# Patient Record
Sex: Male | Born: 2000 | Race: Black or African American | Hispanic: No | Marital: Single | Smoking: Former smoker
Health system: Southern US, Community
[De-identification: ages and names within clinical notes are randomized; demographics above are authoritative.]

## PROBLEM LIST (undated history)

## (undated) DIAGNOSIS — T7840XA Allergy, unspecified, initial encounter: Secondary | ICD-10-CM

## (undated) DIAGNOSIS — K219 Gastro-esophageal reflux disease without esophagitis: Secondary | ICD-10-CM

## (undated) DIAGNOSIS — F419 Anxiety disorder, unspecified: Secondary | ICD-10-CM

## (undated) HISTORY — DX: Allergy, unspecified, initial encounter: T78.40XA

## (undated) HISTORY — PX: NO PAST SURGERIES: SHX2092

## (undated) HISTORY — DX: Anxiety disorder, unspecified: F41.9

## (undated) HISTORY — PX: WISDOM TOOTH EXTRACTION: SHX21

---
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
## 2019-07-23 ENCOUNTER — Other Ambulatory Visit: Payer: Self-pay

## 2019-07-23 DIAGNOSIS — Z20822 Contact with and (suspected) exposure to covid-19: Secondary | ICD-10-CM

## 2019-07-24 ENCOUNTER — Telehealth: Payer: BC Managed Care – PPO | Admitting: Physician Assistant

## 2019-07-24 ENCOUNTER — Encounter (INDEPENDENT_AMBULATORY_CARE_PROVIDER_SITE_OTHER): Payer: Self-pay

## 2019-07-24 DIAGNOSIS — Z20822 Contact with and (suspected) exposure to covid-19: Secondary | ICD-10-CM

## 2019-07-24 NOTE — Progress Notes (Signed)
E-Visit for Corona Virus Screening   Your current symptoms could be consistent with the coronavirus.  Many health care providers can now test patients at their office but not all are.  [facility] has multiple testing sites. For information on our COVID testing locations and hours go to [email or web address]  Please quarantine yourself while awaiting your test results.  We are enrolling you in our MyChart Home Montioring for COVID19 . Daily you will receive a questionnaire within the MyChart website. Our COVID 19 response team willl be monitoriing your responses daily.    COVID-19 is a respiratory illness with symptoms that are similar to the flu. Symptoms are typically mild to moderate, but there have been cases of severe illness and death due to the virus. The following symptoms may appear 2-14 days after exposure: . Fever . Cough . Shortness of breath or difficulty breathing . Chills . Repeated shaking with chills . Muscle pain . Headache . Sore throat . New loss of taste or smell . Fatigue . Congestion or runny nose . Nausea or vomiting . Diarrhea  It is vitally important that if you feel that you have an infection such as this virus or any other virus that you stay home and away from places where you may spread it to others.  You should self-quarantine for 14 days if you have symptoms that could potentially be coronavirus or have been in close contact a with a person diagnosed with COVID-19 within the last 2 weeks. You should avoid contact with people age 65 and older.   You should wear a mask or cloth face covering over your nose and mouth if you must be around other people or animals, including pets (even at home). Try to stay at least 6 feet away from other people. This will protect the people around you.  You may also take acetaminophen (Tylenol) as needed for fever.   Reduce your risk of any infection by using the same precautions used for avoiding the  common cold or flu:  . Wash your hands often with soap and warm water for at least 20 seconds.  If soap and water are not readily available, use an alcohol-based hand sanitizer with at least 60% alcohol.  . If coughing or sneezing, cover your mouth and nose by coughing or sneezing into the elbow areas of your shirt or coat, into a tissue or into your sleeve (not your hands). . Avoid shaking hands with others and consider head nods or verbal greetings only. . Avoid touching your eyes, nose, or mouth with unwashed hands.  . Avoid close contact with people who are sick. . Avoid places or events with large numbers of people in one location, like concerts or sporting events. . Carefully consider travel plans you have or are making. . If you are planning any travel outside or inside the [place], visit the CDC's Travelers' Health webpage for the latest health notices. . If you have some symptoms but not all symptoms, continue to monitor at home and seek medical attention if your symptoms worsen. . If you are having a medical emergency, call 911.  HOME CARE . Only take medications as instructed by your medical team. . Drink plenty of fluids and get plenty of rest. . A steam or ultrasonic humidifier can help if you have congestion.   GET HELP RIGHT AWAY IF YOU HAVE EMERGENCY WARNING SIGNS** FOR COVID-19. If you or someone is showing any of these signs seek emergency medical care immediately. Call   911 or proceed to your closest emergency facility if: . You develop worsening high fever. . Trouble breathing . Bluish lips or face . Persistent pain or pressure in the chest . New confusion . Inability to wake or stay awake . You cough up blood. . Your symptoms become more severe  **This list is not all possible symptoms. Contact your medical provider for any symptoms that are sever or concerning to you.   MAKE SURE YOU   Understand these instructions.  Will watch your condition.  Will get help right  away if you are not doing well or get worse.  Your e-visit answers were reviewed by a board certified advanced clinical practitioner to complete your personal care plan.  Depending on the condition, your plan could have included both over the counter or prescription medications.  If there is a problem please reply once you have received a response from your provider.  Your safety is important to us.  If you have drug allergies check your prescription carefully.    You can use MyChart to ask questions about today's visit, request a non-urgent call back, or ask for a work or school excuse for 24 hours related to this e-Visit. If it has been greater than 24 hours you will need to follow up with your provider, or enter a new e-Visit to address those concerns. You will get an e-mail in the next two days asking about your experience.  I hope that your e-visit has been valuable and will speed your recovery. Thank you for using e-visits.    

## 2019-07-24 NOTE — Progress Notes (Signed)
I have spent 5 minutes in review of e-visit questionnaire, review and updating patient chart, medical decision making and response to patient.   Renne Platts Cody Lindzey Zent, PA-C    

## 2019-07-25 ENCOUNTER — Encounter (INDEPENDENT_AMBULATORY_CARE_PROVIDER_SITE_OTHER): Payer: Self-pay

## 2019-07-25 LAB — NOVEL CORONAVIRUS, NAA: SARS-CoV-2, NAA: NOT DETECTED

## 2019-07-27 ENCOUNTER — Encounter (INDEPENDENT_AMBULATORY_CARE_PROVIDER_SITE_OTHER): Payer: Self-pay

## 2020-06-07 ENCOUNTER — Encounter: Payer: Self-pay | Admitting: Emergency Medicine

## 2020-06-07 ENCOUNTER — Other Ambulatory Visit: Payer: Self-pay

## 2020-06-07 ENCOUNTER — Ambulatory Visit
Admission: EM | Admit: 2020-06-07 | Discharge: 2020-06-07 | Disposition: A | Discharge status: Home / Self Care | Payer: BC Managed Care – PPO | Attending: Emergency Medicine | Admitting: Emergency Medicine

## 2020-06-07 DIAGNOSIS — R1310 Dysphagia, unspecified: Secondary | ICD-10-CM

## 2020-06-07 HISTORY — DX: Gastro-esophageal reflux disease without esophagitis: K21.9

## 2020-06-07 MED ORDER — FAMOTIDINE 20 MG PO TABS: 20.0000 mg | ORAL_TABLET | Freq: Two times a day (BID) | ORAL | 0 refills | Status: DC

## 2020-06-07 MED ORDER — LIDOCAINE VISCOUS HCL 2 % MT SOLN: 15.0000 mL | Freq: Once | OROMUCOSAL | Status: DC

## 2020-06-07 MED ORDER — ALUM & MAG HYDROXIDE-SIMETH 200-200-20 MG/5ML PO SUSP
30.0000 mL | Freq: Once | ORAL | Status: AC
  Administered 2020-06-07: 30 mL via ORAL

## 2020-06-07 MED ORDER — LIDOCAINE VISCOUS HCL 2 % MT SOLN
15.0000 mL | Freq: Once | OROMUCOSAL | Status: AC
  Administered 2020-06-07: 15 mL via ORAL

## 2020-06-07 MED ORDER — ALUM & MAG HYDROXIDE-SIMETH 200-200-20 MG/5ML PO SUSP: 15.0000 mL | Freq: Once | ORAL | Status: DC

## 2020-06-07 NOTE — ED Provider Notes (Signed)
HPI  SUBJECTIVE:  Scott Silva is a 19 y.o. male who presents with 3 days of upper midline sharp chest pain and a sensation of food/liquids getting  "stuck in my mid chest".  He reports equal pain with swallowing liquids and solids.  He denies trauma, recent bone ingestion, foreign body ingestion, partially chewed food.  No regurgitation, drooling.  No problems with his saliva getting "stuck".  No difficulty breathing, fevers, or GERD symptoms.  He states that he has belching, but no more than usual.  No sore throat, neck stiffness, cough, shortness of breath.  He tried salt water gargles, ginger ale.  Symptoms are worse with ginger ale, eating and drinking.  He has had symptoms like this before which resolved with warm salt water.  He has a past medical history of GERD for which he takes omeprazole.  States he is compliant with this.  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  He denies recent ingestion of either 1 of these foods.  No history of esophageal spasm, diabetes, hypertension, cancer.  PMD: [facility].    Past Medical History:  Diagnosis Date  . GERD (gastroesophageal reflux disease)     Past Surgical History:  Procedure Laterality Date  . NO PAST SURGERIES      Family History  Problem Relation Age of Onset  . Migraines Mother   . Healthy Father     Social History   Tobacco Use  . Smoking status: Never Smoker  . Smokeless tobacco: Never Used  Vaping Use  . Vaping Use: Never used  Substance Use Topics  . Alcohol use: No  . Drug use: No    No current facility-administered medications for this encounter.  Current Outpatient Medications:  .  omeprazole (PRILOSEC) 20 MG capsule, Take 20 mg by mouth daily., Disp: , Rfl:  .  famotidine (PEPCID) 20 MG tablet, Take 1 tablet (20 mg total) by mouth 2 (two) times daily., Disp: 60 tablet, Rfl: 0  No Known Allergies   ROS  As noted in HPI.   Physical Exam  BP 113/70 (BP Location: Left Arm)   Pulse  77   Temp 98.3 F (36.8 C) (Oral)   Resp 18   Ht 5\' 7"  (1.702 m)   Wt 102.1 kg   SpO2 99%   BMI 35.24 kg/m   Constitutional: Well developed, well nourished, no acute distress Eyes:  EOMI, conjunctiva normal bilaterally HENT: Normocephalic, atraumatic,mucus membranes moist.  Airway widely patent.  Positive cobblestoning.  Normal oropharynx.  Normal posterior oropharynx.  No drooling, trismus, stridor Neck: No cervical lymphadenopathy, neck stiffness Respiratory: Normal inspiratory effort, lungs clear bilaterally Cardiovascular: Normal rate no murmurs rubs or gallops GI: nondistended skin: No rash, skin intact Musculoskeletal: no deformities Neurologic: Alert & oriented x 3, no focal neuro deficits Psychiatric: Speech and behavior appropriate   ED Course   Medications  alum & mag hydroxide-simeth (MAALOX/MYLANTA) 200-200-20 MG/5ML suspension 30 mL (30 mLs Oral Given 06/07/20 1136)    And  lidocaine (XYLOCAINE) 2 % viscous mouth solution 15 mL (15 mLs Oral Given 06/07/20 1136)    Orders Placed This Encounter  Procedures  . Ambulatory referral to Gastroenterology    Referral Priority:   Urgent    Referral Type:   Consultation    Referral Reason:   Specialty Services Required    Number of Visits Requested:   1    No results found for this or any previous visit (from the past 24 hour(s)).  No results found.  ED Clinical Impression  1. Odynophagia      ED Assessment/Plan  In the differential is esophageal spasm, stricture, globus sensation, eosinophilic esophagitis, achalasia.  Doubt mass compressing the esophagus.  There is no drooling, stridor, evidence of upper airway obstruction.  Appears well-hydrated, his vitals are normal.  He denies foreign body or bone ingestion.  Doubt esophageal rupture or perforation in the absence of forceful vomiting.  Patient has no history of HIV, no HIV risk factors and declines testing for HIV today.  Will give him a GI cocktail, start him  on some Pepcid and place an urgent referral to GI.  Discussed  MDM, treatment plan, and plan for follow-up with patient. Discussed sn/sx that should prompt return to the ED. patient agrees with plan.   Meds ordered this encounter  Medications  . DISCONTD: alum & mag hydroxide-simeth (MAALOX/MYLANTA) 200-200-20 MG/5ML suspension 15 mL  . DISCONTD: lidocaine (XYLOCAINE) 2 % viscous mouth solution 15 mL  . AND Linked Order Group   . alum & mag hydroxide-simeth (MAALOX/MYLANTA) 200-200-20 MG/5ML suspension 30 mL   . lidocaine (XYLOCAINE) 2 % viscous mouth solution 15 mL  . famotidine (PEPCID) 20 MG tablet    Sig: Take 1 tablet (20 mg total) by mouth 2 (two) times daily.    Dispense:  60 tablet    Refill:  0    *This clinic note was created using Scientist, clinical (histocompatibility and immunogenetics). Therefore, there may be occasional mistakes despite careful proofreading.   ?   Domenick Gong, MD 06/07/20 1439

## 2020-06-07 NOTE — Discharge Instructions (Signed)
Small bites of food.  Make sure you chew thoroughly.  Start the Pepcid.  Continue Protonix.  Somebody from [facility] should be contacting you to arrange a follow-up appointment, however, try and get in touch with them to arrange something soon as possible.

## 2020-06-07 NOTE — ED Triage Notes (Signed)
Patient in today c/o difficulty swallowing and pain when he swallows x 2 days. Patient states it feels like something is stuck in his throat/esophagus.

## 2020-06-27 ENCOUNTER — Other Ambulatory Visit: Payer: Self-pay

## 2020-06-28 ENCOUNTER — Other Ambulatory Visit: Payer: Self-pay

## 2020-06-28 ENCOUNTER — Ambulatory Visit: Payer: BC Managed Care – PPO | Admitting: Gastroenterology

## 2020-06-28 ENCOUNTER — Encounter: Payer: Self-pay | Admitting: Gastroenterology

## 2020-06-28 VITALS — BP 137/79 | HR 66 | Temp 98.3°F | Ht 67.0 in | Wt 212.2 lb

## 2020-06-28 DIAGNOSIS — R1319 Other dysphagia: Secondary | ICD-10-CM

## 2020-06-28 DIAGNOSIS — R1310 Dysphagia, unspecified: Secondary | ICD-10-CM | POA: Diagnosis not present

## 2020-06-28 NOTE — Progress Notes (Signed)
Arlyss Repress, MD [address]  [address]  [place], [place] [address]  Main: [phone]  Fax: [phone]    Gastroenterology Consultation  Referring Provider:     Domenick Gong, MD Primary Care Physician:  Pcp, No Primary Gastroenterologist:  Dr. Arlyss Repress Reason for Consultation:     Difficulty swallowing        HPI:   Scott Silva is a 19 y.o. male referred by Dr. Oneita Hurt, No  for consultation & management of intermittent episodes of difficulty swallowing.  He reports history of sensation of feeling lump in his throat that occurred about 2 weeks ago.  He ate taco from [employer], woke up with sensation of food stuck in his throat.  He has history of allergy to peanuts.  Patient was taking omeprazole in the past.  Currently he is taking Pepcid 20 mg once a day.  He also has seasonal allergies.  He denies any other symptoms He does not smoke or drink alcohol  NSAIDs: None  Antiplts/Anticoagulants/Anti thrombotics: None  GI Procedures: None  Past Medical History:  Diagnosis Date  . GERD (gastroesophageal reflux disease)     Past Surgical History:  Procedure Laterality Date  . NO PAST SURGERIES      Current Outpatient Medications:  .  famotidine (PEPCID) 20 MG tablet, Take 1 tablet (20 mg total) by mouth 2 (two) times daily., Disp: 60 tablet, Rfl: 0 .  fluticasone (FLONASE) 50 MCG/ACT nasal spray, 1 spray by Each Nare route daily., Disp: , Rfl:    Family History  Problem Relation Age of Onset  . Migraines Mother   . Healthy Father      Social History   Tobacco Use  . Smoking status: Never Smoker  . Smokeless tobacco: Never Used  Vaping Use  . Vaping Use: Never used  Substance Use Topics  . Alcohol use: No  . Drug use: No    Allergies as of 06/28/2020  . (No Known Allergies)    Review of Systems:    All systems reviewed and negative except where noted in HPI.   Physical Exam:  BP 137/79 (BP Location: Left Arm, Patient Position:  Sitting, Cuff Size: Normal)   Pulse 66   Temp 98.3 F (36.8 C) (Oral)   Ht 5\' 7"  (1.702 m)   Wt 212 lb 4 oz (96.3 kg)   BMI 33.24 kg/m  No LMP for male patient.  General:   Alert,  Well-developed, well-nourished, pleasant and cooperative in NAD Head:  Normocephalic and atraumatic. Eyes:  Sclera clear, no icterus.   Conjunctiva pink. Ears:  Normal auditory acuity. Nose:  No deformity, discharge, or lesions. Mouth:  No deformity or lesions,oropharynx pink & moist. Neck:  Supple; no masses or thyromegaly. Lungs:  Respirations even and unlabored.  Clear throughout to auscultation.   No wheezes, crackles, or rhonchi. No acute distress. Heart:  Regular rate and rhythm; no murmurs, clicks, rubs, or gallops. Abdomen:  Normal bowel sounds. Soft, non-tender and non-distended without masses, hepatosplenomegaly or hernias noted.  No guarding or rebound tenderness.   Rectal: Not performed Msk:  Symmetrical without gross deformities. Good, equal movement & strength bilaterally. Pulses:  Normal pulses noted. Extremities:  No clubbing or edema.  No cyanosis. Neurologic:  Alert and oriented x3;  grossly normal neurologically. Skin:  Intact without significant lesions or rashes. No jaundice. Psych:  Alert and cooperative. Normal mood and affect.  Imaging Studies: None  Assessment and Plan:   Scott Silva  is a 19 y.o. African-American male with sensation of lump in the throat  Recommend EGD with proximal and distal esophageal biopsies Continue Pepcid as needed for now Advised to avoid hard meats/foods   Follow up after the endoscopy   Arlyss Repress, MD

## 2020-07-03 ENCOUNTER — Encounter: Payer: Self-pay | Admitting: Gastroenterology

## 2020-07-03 ENCOUNTER — Other Ambulatory Visit: Payer: Self-pay

## 2020-07-04 ENCOUNTER — Other Ambulatory Visit
Admission: RE | Admit: 2020-07-04 | Discharge: 2020-07-04 | Disposition: A | Discharge status: Home / Self Care | Payer: BC Managed Care – PPO | Source: Ambulatory Visit | Attending: Gastroenterology | Admitting: Gastroenterology

## 2020-07-04 DIAGNOSIS — Z20822 Contact with and (suspected) exposure to covid-19: Secondary | ICD-10-CM | POA: Diagnosis not present

## 2020-07-04 DIAGNOSIS — Z01812 Encounter for preprocedural laboratory examination: Secondary | ICD-10-CM | POA: Insufficient documentation

## 2020-07-05 LAB — SARS CORONAVIRUS 2 (TAT 6-24 HRS): SARS Coronavirus 2: NEGATIVE

## 2020-07-05 NOTE — Discharge Instructions (Signed)
General Anesthesia, Adult, Care After This sheet gives you information about how to care for yourself after your procedure. Your health care provider may also give you more specific instructions. If you have problems or questions, contact your health care provider. What can I expect after the procedure? After the procedure, the following side effects are common:  Pain or discomfort at the IV site.  Nausea.  Vomiting.  Sore throat.  Trouble concentrating.  Feeling cold or chills.  Weak or tired.  Sleepiness and fatigue.  Soreness and body aches. These side effects can affect parts of the body that were not involved in surgery. Follow these instructions at home:  For at least 24 hours after the procedure:  Have a responsible adult stay with you. It is important to have someone help care for you until you are awake and alert.  Rest as needed.  Do not: ? Participate in activities in which you could fall or become injured. ? Drive. ? Use heavy machinery. ? Drink alcohol. ? Take sleeping pills or medicines that cause drowsiness. ? Make important decisions or sign legal documents. ? Take care of children on your own. Eating and drinking  Follow any instructions from your health care provider about eating or drinking restrictions.  When you feel hungry, start by eating small amounts of foods that are soft and easy to digest (bland), such as toast. Gradually return to your regular diet.  Drink enough fluid to keep your urine pale yellow.  If you vomit, rehydrate by drinking water, juice, or clear broth. General instructions  If you have sleep apnea, surgery and certain medicines can increase your risk for breathing problems. Follow instructions from your health care provider about wearing your sleep device: ? Anytime you are sleeping, including during daytime naps. ? While taking prescription pain medicines, sleeping medicines, or medicines that make you drowsy.  Return to  your normal activities as told by your health care provider. Ask your health care provider what activities are safe for you.  Take over-the-counter and prescription medicines only as told by your health care provider.  If you smoke, do not smoke without supervision.  Keep all follow-up visits as told by your health care provider. This is important. Contact a health care provider if:  You have nausea or vomiting that does not get better with medicine.  You cannot eat or drink without vomiting.  You have pain that does not get better with medicine.  You are unable to pass urine.  You develop a skin rash.  You have a fever.  You have redness around your IV site that gets worse. Get help right away if:  You have difficulty breathing.  You have chest pain.  You have blood in your urine or stool, or you vomit blood. Summary  After the procedure, it is common to have a sore throat or nausea. It is also common to feel tired.  Have a responsible adult stay with you for the first 24 hours after general anesthesia. It is important to have someone help care for you until you are awake and alert.  When you feel hungry, start by eating small amounts of foods that are soft and easy to digest (bland), such as toast. Gradually return to your regular diet.  Drink enough fluid to keep your urine pale yellow.  Return to your normal activities as told by your health care provider. Ask your health care provider what activities are safe for you. This information is not   intended to replace advice given to you by your health care provider. Make sure you discuss any questions you have with your health care provider. Document Revised: 10/03/2017 Document Reviewed: 05/16/2017 Elsevier Patient Education  2020 Elsevier Inc[employer]  

## 2020-07-06 ENCOUNTER — Encounter
Admission: RE | Disposition: A | Discharge status: Home / Self Care | Payer: Self-pay | Source: Home / Self Care | Attending: Gastroenterology

## 2020-07-06 ENCOUNTER — Ambulatory Visit: Payer: BC Managed Care – PPO | Admitting: Anesthesiology

## 2020-07-06 ENCOUNTER — Ambulatory Visit
Admission: RE | Admit: 2020-07-06 | Discharge: 2020-07-06 | Disposition: A | Discharge status: Home / Self Care | Payer: BC Managed Care – PPO | Attending: Gastroenterology | Admitting: Gastroenterology

## 2020-07-06 ENCOUNTER — Encounter: Payer: Self-pay | Admitting: Gastroenterology

## 2020-07-06 ENCOUNTER — Other Ambulatory Visit: Payer: Self-pay

## 2020-07-06 DIAGNOSIS — K219 Gastro-esophageal reflux disease without esophagitis: Secondary | ICD-10-CM | POA: Insufficient documentation

## 2020-07-06 DIAGNOSIS — Z79899 Other long term (current) drug therapy: Secondary | ICD-10-CM | POA: Insufficient documentation

## 2020-07-06 DIAGNOSIS — R12 Heartburn: Secondary | ICD-10-CM | POA: Diagnosis present

## 2020-07-06 DIAGNOSIS — K449 Diaphragmatic hernia without obstruction or gangrene: Secondary | ICD-10-CM | POA: Insufficient documentation

## 2020-07-06 DIAGNOSIS — R1310 Dysphagia, unspecified: Secondary | ICD-10-CM

## 2020-07-06 HISTORY — PX: ESOPHAGOGASTRODUODENOSCOPY (EGD) WITH PROPOFOL: SHX5813

## 2020-07-06 SURGERY — ESOPHAGOGASTRODUODENOSCOPY (EGD) WITH PROPOFOL
Anesthesia: General | Site: Mouth

## 2020-07-06 MED ORDER — GLYCOPYRROLATE 0.2 MG/ML IJ SOLN
INTRAMUSCULAR | Status: DC | PRN
  Administered 2020-07-06: .1 mg via INTRAVENOUS

## 2020-07-06 MED ORDER — LIDOCAINE HCL (CARDIAC) PF 100 MG/5ML IV SOSY
PREFILLED_SYRINGE | INTRAVENOUS | Status: DC | PRN
  Administered 2020-07-06: 60 mg via INTRAVENOUS

## 2020-07-06 MED ORDER — PROPOFOL 10 MG/ML IV BOLUS
INTRAVENOUS | Status: DC | PRN
  Administered 2020-07-06: 30 mg via INTRAVENOUS
  Administered 2020-07-06: 70 mg via INTRAVENOUS
  Administered 2020-07-06: 50 mg via INTRAVENOUS

## 2020-07-06 MED ORDER — LACTATED RINGERS IV SOLN: INTRAVENOUS | Status: DC

## 2020-07-06 SURGICAL SUPPLY — 7 items
BLOCK BITE 60FR ADLT L/F GRN (MISCELLANEOUS) ×3 IMPLANT
FORCEPS BIOP RAD 4 LRG CAP 4 (CUTTING FORCEPS) ×3 IMPLANT
GOWN CVR UNV OPN BCK APRN NK (MISCELLANEOUS) ×2 IMPLANT
GOWN ISOL THUMB LOOP REG UNIV (MISCELLANEOUS) ×6
KIT ENDO PROCEDURE OLY (KITS) ×3 IMPLANT
MANIFOLD NEPTUNE II (INSTRUMENTS) ×3 IMPLANT
WATER STERILE IRR 250ML POUR (IV SOLUTION) ×3 IMPLANT

## 2020-07-06 NOTE — Anesthesia Postprocedure Evaluation (Signed)
Anesthesia Post Note  Patient: Database administrator  Procedure(s) Performed: ESOPHAGOGASTRODUODENOSCOPY (EGD) WITH BIOPSY (N/A Mouth)     Patient location during evaluation: PACU Anesthesia Type: General Level of consciousness: awake and alert and oriented Pain management: satisfactory to patient Vital Signs Assessment: post-procedure vital signs reviewed and stable Respiratory status: spontaneous breathing, nonlabored ventilation and respiratory function stable Cardiovascular status: blood pressure returned to baseline and stable Postop Assessment: Adequate PO intake and No signs of nausea or vomiting Anesthetic complications: no   No complications documented.  Cherly Beach

## 2020-07-06 NOTE — Anesthesia Preprocedure Evaluation (Signed)
Anesthesia Evaluation  Patient identified by MRN, date of birth, ID band Patient awake    Reviewed: Allergy & Precautions, H&P , NPO status , Patient's Chart, lab work & pertinent test results  Airway Mallampati: II  TM Distance: >3 FB Neck ROM: full    Dental no notable dental hx.    Pulmonary    Pulmonary exam normal breath sounds clear to auscultation       Cardiovascular Normal cardiovascular exam Rhythm:regular Rate:Normal     Neuro/Psych    GI/Hepatic GERD  ,  Endo/Other    Renal/GU      Musculoskeletal   Abdominal   Peds  Hematology   Anesthesia Other Findings   Reproductive/Obstetrics                             Anesthesia Physical Anesthesia Plan  ASA: II  Anesthesia Plan: General   Post-op Pain Management:    Induction: Intravenous  PONV Risk Score and Plan: 2 and Treatment may vary due to age or medical condition, Propofol infusion and TIVA  Airway Management Planned: Natural Airway  Additional Equipment:   Intra-op Plan:   Post-operative Plan:   Informed Consent: I have reviewed the patients History and Physical, chart, labs and discussed the procedure including the risks, benefits and alternatives for the proposed anesthesia with the patient or authorized representative who has indicated his/her understanding and acceptance.     Dental Advisory Given  Plan Discussed with: CRNA  Anesthesia Plan Comments:         Anesthesia Quick Evaluation  

## 2020-07-06 NOTE — H&P (Signed)
Arlyss Repress, MD [address]  [address]  [place], [place] [address]  Main: [phone]  Fax: [phone] Pager: [phone]  Primary Care Physician:  Pcp, No Primary Gastroenterologist:  Dr. Arlyss Repress  Pre-Procedure History & Physical: HPI:  Scott Silva is a 19 y.o. male is here for an endoscopy.   Past Medical History:  Diagnosis Date   GERD (gastroesophageal reflux disease)     Past Surgical History:  Procedure Laterality Date   NO PAST SURGERIES      Prior to Admission medications   Medication Sig Start Date End Date Taking? Authorizing Provider  famotidine (PEPCID) 20 MG tablet Take 1 tablet (20 mg total) by mouth 2 (two) times daily. 06/07/20  Yes Domenick Gong, MD  fluticasone (FLONASE) 50 MCG/ACT nasal spray 1 spray by Each Nare route daily. 08/20/18  Yes [provider]    Allergies as of 06/28/2020   (No Known Allergies)    Family History  Problem Relation Age of Onset   Migraines Mother    Healthy Father     Social History   Socioeconomic History   Marital status: Single    Spouse name: Not on file   Number of children: Not on file   Years of education: Not on file   Highest education level: Not on file  Occupational History   Not on file  Tobacco Use   Smoking status: Never Smoker   Smokeless tobacco: Never Used  Vaping Use   Vaping Use: Never used  Substance and Sexual Activity   Alcohol use: No   Drug use: No   Sexual activity: Not on file  Other Topics Concern   Not on file  Social History Narrative   Not on file   Social Determinants of Health   Financial Resource Strain:    Difficulty of Paying Living Expenses: Not on file  Food Insecurity:    Worried About Running Out of Food in the Last Year: Not on file   Ran Out of Food in the Last Year: Not on file  Transportation Needs:    Lack of Transportation (Medical): Not on file   Lack of Transportation (Non-Medical): Not on file   Physical Activity:    Days of Exercise per Week: Not on file   Minutes of Exercise per Session: Not on file  Stress:    Feeling of Stress : Not on file  Social Connections:    Frequency of Communication with Friends and Family: Not on file   Frequency of Social Gatherings with Friends and Family: Not on file   Attends Religious Services: Not on file   Active Member of Clubs or Organizations: Not on file   Attends Banker Meetings: Not on file   Marital Status: Not on file  Intimate Partner Violence:    Fear of Current or Ex-Partner: Not on file   Emotionally Abused: Not on file   Physically Abused: Not on file   Sexually Abused: Not on file    Review of Systems: See HPI, otherwise negative ROS  Physical Exam: BP 115/72    Pulse 63    Temp 97.7 F (36.5 C) (Temporal)    Resp 18    Ht 5\' 6"  (1.676 m)    Wt 96.2 kg    SpO2 100%    BMI 34.22 kg/m  General:   Alert,  pleasant and cooperative in NAD Head:  Normocephalic and atraumatic. Neck:  Supple; no masses or thyromegaly. Lungs:  Clear throughout to auscultation.    Heart:  Regular rate and rhythm. Abdomen:  Soft, nontender and nondistended. Normal bowel sounds, without guarding, and without rebound.   Neurologic:  Alert and  oriented x4;  grossly normal neurologically.  Impression/Plan: Scott Silva is here for an endoscopy to be performed for dysphagia  Risks, benefits, limitations, and alternatives regarding  endoscopy have been reviewed with the patient.  Questions have been answered.  All parties agreeable.   Lannette Donath, MD  07/06/2020, 8:53 AM

## 2020-07-06 NOTE — Op Note (Signed)
[facility] Patient Name: Scott Silva Procedure Date: 07/06/2020 9:27 AM MRN: [id number] Account #: [id number] Date of Birth: 07-27-2001 Admit Type: Outpatient Age: 19 Room: [facility] OR ROOM 01 Gender: Male Note Status: Finalized Procedure:             Upper GI endoscopy Indications:           Dysphagia, Heartburn Providers:             Lin Landsman MD, MD Medicines:             Monitored Anesthesia Care Complications:         No immediate complications. Estimated blood loss: None. Procedure:             Pre-Anesthesia Assessment:                        - Prior to the procedure, a History and Physical was                         performed, and patient medications and allergies were                         reviewed. The patient is competent. The risks and                         benefits of the procedure and the sedation options and                         risks were discussed with the patient. All questions                         were answered and informed consent was obtained.                         Patient identification and proposed procedure were                         verified by the physician, the nurse, the                         anesthesiologist, the anesthetist and the technician                         in the pre-procedure area in the procedure room in the                         endoscopy suite. Mental Status Examination: alert and                         oriented. Airway Examination: normal oropharyngeal                         airway and neck mobility. Respiratory Examination:                         clear to auscultation. CV Examination: normal.                         Prophylactic Antibiotics: The patient does not require  prophylactic antibiotics. Prior Anticoagulants: The                         patient has taken no previous anticoagulant or                         antiplatelet agents. ASA Grade  Assessment: II - A                         patient with mild systemic disease. After reviewing                         the risks and benefits, the patient was deemed in                         satisfactory condition to undergo the procedure. The                         anesthesia plan was to use monitored anesthesia care                         (MAC). Immediately prior to administration of                         medications, the patient was re-assessed for adequacy                         to receive sedatives. The heart rate, respiratory                         rate, oxygen saturations, blood pressure, adequacy of                         pulmonary ventilation, and response to care were                         monitored throughout the procedure. The physical                         status of the patient was re-assessed after the                         procedure.                        After obtaining informed consent, the endoscope was                         passed under direct vision. Throughout the procedure,                         the patient's blood pressure, pulse, and oxygen                         saturations were monitored continuously. The was                         introduced through the mouth, and advanced to the  second part of duodenum. The upper GI endoscopy was                         accomplished without difficulty. The patient tolerated                         the procedure well. Findings:      The esophagus, gastroesophageal junction and examined esophagus were       normal. Biopsies were taken with a cold forceps for histology.      The stomach was normal.      The examined duodenum was normal.      A small hiatal hernia was present.      Esophagogastric landmarks were identified: the gastroesophageal junction       was found at 37 cm from the incisors. Impression:            - Normal esophagus and gastroesophageal junction.                          Biopsied.                        - Normal stomach.                        - Normal examined duodenum.                        - Small hiatal hernia.                        - Esophagogastric landmarks identified. Recommendation:        - Await pathology results.                        - Discharge patient to home (with escort).                        - Resume previous diet today.                        - Continue present medications.                        - Await pathology results. Procedure Code(s):     --- Professional ---                        [phone], Esophagogastroduodenoscopy, flexible,                         transoral; with biopsy, single or multiple Diagnosis Code(s):     --- Professional ---                        K44.9, Diaphragmatic hernia without obstruction or                         gangrene                        R13.10, Dysphagia, unspecified  R12, Heartburn CPT copyright 2019 American Medical Association. All rights reserved. The codes documented in this report are preliminary and upon coder review may  be revised to meet current compliance requirements. Dr. Ulyess Mort Lin Landsman MD, MD 07/06/2020 9:44:10 AM This report has been signed electronically. Number of Addenda: 0 Note Initiated On: 07/06/2020 9:27 AM Estimated Blood Loss:  Estimated blood loss: none.      [facility]

## 2020-07-06 NOTE — Anesthesia Procedure Notes (Signed)
Procedure Name: MAC Date/Time: 07/06/2020 9:37 AM Performed by: Silvana Newness, CRNA Pre-anesthesia Checklist: Patient identified, Emergency Drugs available, Suction available, Patient being monitored and Timeout performed Patient Re-evaluated:Patient Re-evaluated prior to induction Oxygen Delivery Method: Nasal cannula Induction Type: IV induction Placement Confirmation: positive ETCO2

## 2020-07-06 NOTE — Transfer of Care (Signed)
Immediate Anesthesia Transfer of Care Note  Patient: Scott Silva  Procedure(s) Performed: ESOPHAGOGASTRODUODENOSCOPY (EGD) WITH BIOPSY (N/A Mouth)  Patient Location: PACU  Anesthesia Type: General  Level of Consciousness: awake, alert  and patient cooperative  Airway and Oxygen Therapy: Patient Spontanous Breathing and Patient connected to supplemental oxygen  Post-op Assessment: Post-op Vital signs reviewed, Patient's Cardiovascular Status Stable, Respiratory Function Stable, Patent Airway and No signs of Nausea or vomiting  Post-op Vital Signs: Reviewed and stable  Complications: No complications documented.

## 2020-07-07 ENCOUNTER — Encounter: Payer: Self-pay | Admitting: Gastroenterology

## 2020-07-10 ENCOUNTER — Telehealth: Payer: Self-pay

## 2020-07-10 LAB — SURGICAL PATHOLOGY

## 2020-07-10 NOTE — Telephone Encounter (Signed)
-----   Message from Toney Reil, MD sent at 07/10/2020  2:53 PM EDT ----- Pathology results from recent upper endoscopy suggest mild inflammation in the esophagus, therefore, recommend trial of Prilosec 40 mg once a day before breakfast for 3 months  Recommend follow-up in 3 monthsRohini Vanga

## 2020-07-10 NOTE — Telephone Encounter (Signed)
Called and left a message for call back  

## 2020-07-11 MED ORDER — OMEPRAZOLE 40 MG PO CPDR: 40.0000 mg | DELAYED_RELEASE_CAPSULE | Freq: Every day | ORAL | 3 refills | Status: DC

## 2020-07-11 NOTE — Telephone Encounter (Signed)
Called and left a message for call back. Sent mychart message and sent medication to the pharmacy  

## 2021-11-13 ENCOUNTER — Emergency Department
Admission: EM | Admit: 2021-11-13 | Discharge: 2021-11-13 | Disposition: A | Discharge status: Home / Self Care | Payer: BC Managed Care – PPO | Attending: Emergency Medicine | Admitting: Emergency Medicine

## 2021-11-13 ENCOUNTER — Emergency Department: Payer: BC Managed Care – PPO

## 2021-11-13 ENCOUNTER — Other Ambulatory Visit: Payer: Self-pay

## 2021-11-13 DIAGNOSIS — F410 Panic disorder [episodic paroxysmal anxiety] without agoraphobia: Secondary | ICD-10-CM | POA: Insufficient documentation

## 2021-11-13 DIAGNOSIS — R531 Weakness: Secondary | ICD-10-CM | POA: Diagnosis not present

## 2021-11-13 DIAGNOSIS — R791 Abnormal coagulation profile: Secondary | ICD-10-CM | POA: Insufficient documentation

## 2021-11-13 DIAGNOSIS — R202 Paresthesia of skin: Secondary | ICD-10-CM | POA: Insufficient documentation

## 2021-11-13 DIAGNOSIS — R200 Anesthesia of skin: Secondary | ICD-10-CM

## 2021-11-13 LAB — COMPREHENSIVE METABOLIC PANEL
ALT: 20 U/L (ref 0–44)
AST: 19 U/L (ref 15–41)
Albumin: 4.7 g/dL (ref 3.5–5.0)
Alkaline Phosphatase: 77 U/L (ref 38–126)
Anion gap: 8 (ref 5–15)
BUN: 13 mg/dL (ref 6–20)
CO2: 23 mmol/L (ref 22–32)
Calcium: 9.6 mg/dL (ref 8.9–10.3)
Chloride: 105 mmol/L (ref 98–111)
Creatinine, Ser: 0.98 mg/dL (ref 0.61–1.24)
GFR, Estimated: >60 mL/min (ref >60)
Glucose, Bld: 102 mg/dL — ABNORMAL HIGH (ref 70–99)
Potassium: 3.6 mmol/L (ref 3.5–5.1)
Sodium: 136 mmol/L (ref 135–145)
Total Bilirubin: 0.4 mg/dL (ref 0.3–1.2)
Total Protein: 8 g/dL (ref 6.5–8.1)

## 2021-11-13 LAB — CBC
HCT: 43.2 % (ref 39.0–52.0)
Hemoglobin: 14 g/dL (ref 13.0–17.0)
MCH: 26.9 pg (ref 26.0–34.0)
MCHC: 32.4 g/dL (ref 30.0–36.0)
MCV: 83.1 fL (ref 80.0–100.0)
Platelets: 299 10*3/uL (ref 150–400)
RBC: 5.2 MIL/uL (ref 4.22–5.81)
RDW: 13.6 % (ref 11.5–15.5)
WBC: 7.4 10*3/uL (ref 4.0–10.5)
nRBC: 0 % (ref 0.0–0.2)

## 2021-11-13 LAB — DIFFERENTIAL
Abs Immature Granulocytes: 0.02 10*3/uL (ref 0.00–0.07)
Basophils Absolute: 0.1 10*3/uL (ref 0.0–0.1)
Basophils Relative: 1 %
Eosinophils Absolute: 0.2 10*3/uL (ref 0.0–0.5)
Eosinophils Relative: 2 %
Immature Granulocytes: 0 %
Lymphocytes Relative: 27 %
Lymphs Abs: 2 10*3/uL (ref 0.7–4.0)
Monocytes Absolute: 0.6 10*3/uL (ref 0.1–1.0)
Monocytes Relative: 8 %
Neutro Abs: 4.6 10*3/uL (ref 1.7–7.7)
Neutrophils Relative %: 62 %

## 2021-11-13 LAB — CBG MONITORING, ED
Glucose-Capillary: 62 mg/dL — ABNORMAL LOW (ref 70–99)
Glucose-Capillary: 115 mg/dL — ABNORMAL HIGH (ref 70–99)
Glucose-Capillary: 69 mg/dL — ABNORMAL LOW (ref 70–99)

## 2021-11-13 LAB — PROTIME-INR
INR: 1 (ref 0.8–1.2)
Prothrombin Time: 13.1 seconds (ref 11.4–15.2)

## 2021-11-13 LAB — APTT: aPTT: 28 seconds (ref 24–36)

## 2021-11-13 MED ORDER — SODIUM CHLORIDE 0.9% FLUSH
3.0000 mL | Freq: Once | INTRAVENOUS | Status: AC
  Administered 2021-11-13: 3 mL via INTRAVENOUS

## 2021-11-13 MED ORDER — LORAZEPAM 1 MG PO TABS
1.0000 mg | ORAL_TABLET | Freq: Once | ORAL | Status: AC
  Administered 2021-11-13: 1 mg via ORAL
  Filled 2021-11-13: qty 1

## 2021-11-13 NOTE — ED Notes (Signed)
Patient provided with orange juice  

## 2021-11-13 NOTE — ED Triage Notes (Addendum)
Pt comes into the ED via EMS from home with c/o left arm numbness and left facial numbness, states sx started at 11am today when he woke up, pt c/o pressure in his head. No facial droop, no drift noted on arrival  146/82 100%RA [id number]

## 2021-11-13 NOTE — ED Triage Notes (Signed)
Pt reports went to bed at 2130 last night, woke up at 1100 and noticed "pressure" in roof of mouth and numbness in left hand. Clear speech. Weaker grip in left hand. No drift. No facial droop. Alert and oriented.

## 2021-11-13 NOTE — ED Provider Notes (Signed)
[facility] Provider Note    Event Date/Time   First MD Initiated Contact with Patient 11/13/21 1336     (approximate)   History   Numbness   HPI  Scott Silva is a 21 y.o. male with past medical history of anxiety and GERD who presents with numbness.  Patient took a nap and around 11 AM woke up felt that his left hand and arm were numb.  Also endorses numb feeling in the bilateral temporal region.  He has associated weakness with left grip.  Denies visual change difficulty speaking or any symptoms in his legs.  Patient has experienced associated anxiety.  Patient tells me has a history of anxiety and has had numbness in the past with prior episodes of anxiety.  Not currently take anything for it.  Denies any drug or alcohol use.  Review of prior records patient was seen in the ED on 1/9 for facial numbness and bilateral hand tingling and was discharged on p.o. Ativan.  Was noted at that time that patient had had a recent traumatic event, 72-year-old cousin had died suddenly in patient had been experiencing multiple somatic symptoms since that time.    Past Medical History:  Diagnosis Date   GERD (gastroesophageal reflux disease)     Patient Active Problem List   Diagnosis Date Noted   Dysphagia    Overweight in childhood with body mass index (BMI) of 85th to 94.9th percentile 08/20/2017   Allergic rhinitis 12/11/2016   Acne 04/13/2013     Physical Exam  Triage Vital Signs: ED Triage Vitals [11/13/21 1214]  Enc Vitals Group     BP 125/63     Pulse Rate (!) 58     Resp 20     Temp 98.3 F (36.8 C)     Temp Source Oral     SpO2 100 %     Weight 189 lb (85.7 kg)     Height 5\' 6"  (1.676 m)     Head Circumference      Peak Flow      Pain Score 0     Pain Loc      Pain Edu?      Excl. in GC?     Most recent vital signs: Vitals:   11/13/21 1214 11/13/21 1341  BP: 125/63 125/68  Pulse: (!) 58 62  Resp: 20 18  Temp: 98.3 F (36.8 C)    SpO2: 100% 100%     General: Awake, patient is tearful, diffuse body shaking CV:  Good peripheral perfusion.  Resp:  Normal effort.  Abd:  No distention.  Neuro:             Awake, Alert, Oriented x 3  Other:  Patient appears very anxious, speech is soft Aox3, nml speech  PERRL, EOMI, face symmetric, nml tongue movement  Subjective decrease sensation over the bilateral temples but not involving the forehead or mandibular region 5/5 strength in the BL upper and lower extremities with elbow flexion, extension and there is no pronator drift Decreased strength with left grip compared to right Subjective decrease sensation in the right hand up to the wrist, normal sensation in the bilateral upper arms Finger-nose-finger intact BL    ED Results / Procedures / Treatments  Labs (all labs ordered are listed, but only abnormal results are displayed) Labs Reviewed  COMPREHENSIVE METABOLIC PANEL - Abnormal; Notable for the following components:      Result Value   Glucose, Bld 102 (*)  All other components within normal limits  CBG MONITORING, ED - Abnormal; Notable for the following components:   Glucose-Capillary 62 (*)    All other components within normal limits  CBG MONITORING, ED - Abnormal; Notable for the following components:   Glucose-Capillary 69 (*)    All other components within normal limits  CBG MONITORING, ED - Abnormal; Notable for the following components:   Glucose-Capillary 115 (*)    All other components within normal limits  PROTIME-INR  APTT  CBC  DIFFERENTIAL     EKG  [place] bradycardia, normal axis normal intervals no acute ischemic changes   RADIOLOGY I reviewed the CT scan of the brain which does not show any acute intracranial process; agree with radiology report     PROCEDURES:  Critical Care performed: No     MEDICATIONS ORDERED IN ED: Medications  sodium chloride flush (NS) 0.9 % injection 3 mL (3 mLs Intravenous Given 11/13/21 1341)   LORazepam (ATIVAN) tablet 1 mg (1 mg Oral Given 11/13/21 1423)     IMPRESSION / MDM / ASSESSMENT AND PLAN / ED COURSE  I reviewed the triage vital signs and the nursing notes.                              Differential diagnosis includes, but is not limited to, anxiety attack, functional disorder, CVA, demyelinating disease  Patient is a 21 year old male presents with acute onset of numbness after waking from a nap today.  This is associated with significant anxiety.  On exam patient appears somewhat distressed he is tearful and has shaking and appears very anxious.  His neurologic exam overall is reassuring and that his weakness is really only with grip but he has full strength with elbow flexion and extension and there is no pronator drift.  Sensory deficit is also restricted to the hand and just above the wrist, and in the bilateral temples but not involving the forehead or mandible.  The deficits really do not correspond to a neurologic territory.  I reviewed recent ED visit at outside hospital where patient presented similarly and it was felt that his symptoms were due to anxiety and that time it was noted that he had had a death of a young cousin several days before.  I have very low suspicion for acute CVA at this time.  Demyelinating disorder is always possible but also in the setting of the anxiety and prior symptoms feel that is more likely to be functional versus related to anxiety.  I reviewed his blood work which is notable for a sugar of 62-says he had not eaten much today 4.  Rest of his labs including a CBC and CMP are reassuring.  CT head was obtained which shows no acute pathology.  We will give him a 1mg  of Ativan for anxiety and reassess.  After Ativan patient looks significantly more comfortable and is calm.  On repeat assessment he has full grip strength now in the left hand and says that the numbness is improving.  On further discussion patient witnessed his 23-year-old cousin has  a cardiac arrest from bleeding tonsils and has been struggling with this since.  I encouraged him to speak with a therapist or psychiatrist will refer to [facility].  Patient's grandmother who is at bedside understands and is in agreement with the plan.  He is stable for discharge.  Clinical Course as of 11/13/21 1502  Tue Nov 13, 2021  1359 Creatinine: 0.98 [KM]    Clinical Course User Index [KM] Georga Hacking, MD     FINAL CLINICAL IMPRESSION(S) / ED DIAGNOSES   Final diagnoses:  Numbness  Panic attack     Rx / DC Orders   ED Discharge Orders     None        Note:  This document was prepared using Dragon voice recognition software and may include unintentional dictation errors.   Georga Hacking, MD 11/13/21 [phone]

## 2021-12-18 ENCOUNTER — Ambulatory Visit: Payer: BC Managed Care – PPO | Admitting: Family Medicine

## 2021-12-18 ENCOUNTER — Other Ambulatory Visit: Payer: Self-pay

## 2021-12-18 ENCOUNTER — Encounter: Payer: Self-pay | Admitting: Family Medicine

## 2021-12-18 VITALS — BP 114/72 | HR 63 | Ht 66.0 in | Wt 189.0 lb

## 2021-12-18 DIAGNOSIS — R079 Chest pain, unspecified: Secondary | ICD-10-CM | POA: Diagnosis not present

## 2021-12-18 DIAGNOSIS — F418 Other specified anxiety disorders: Secondary | ICD-10-CM | POA: Diagnosis not present

## 2021-12-18 DIAGNOSIS — K219 Gastro-esophageal reflux disease without esophagitis: Secondary | ICD-10-CM | POA: Diagnosis not present

## 2021-12-18 DIAGNOSIS — R0789 Other chest pain: Secondary | ICD-10-CM | POA: Insufficient documentation

## 2021-12-18 MED ORDER — HYDROXYZINE PAMOATE 25 MG PO CAPS: 25.0000 mg | ORAL_CAPSULE | Freq: Three times a day (TID) | ORAL | 0 refills | Status: DC | PRN

## 2021-12-18 MED ORDER — VENLAFAXINE HCL ER 75 MG PO CP24: 75.0000 mg | ORAL_CAPSULE | Freq: Every day | ORAL | 1 refills | Status: DC

## 2021-12-18 MED ORDER — PANTOPRAZOLE SODIUM 40 MG PO TBEC: 40.0000 mg | DELAYED_RELEASE_TABLET | Freq: Every day | ORAL | 3 refills | Status: DC

## 2021-12-18 NOTE — Assessment & Plan Note (Signed)
Condition, previously managed with omeprazole and famotidine.  Given his stated history of chest pain, this may represent a component of the underlying etiology.  To further evaluate this from a diagnostic and therapeutic standpoint, I have advised transition to pantoprazole 40 mg daily on an empty stomach until his return. ? ?Chronic condition, symptomatic, Rx management ?

## 2021-12-18 NOTE — Patient Instructions (Signed)
-   Start Effexor, dose daily until follow-up ?- Can dose hydroxyzine on as-needed basis for anxiety attacks ?- If still symptomatic after hydroxyzine, can use your previously prescribed Ativan ?-Stop omeprazole, pantoprazole daily, 30 minutes before first meal on empty stomach ?- Pick up over-the-counter Voltaren gel (diclofenac 1%), use 2-4 times per day at painful site along chest ?- Obtain labs while fasting ?- Referral coronary will contact you to schedule follow-up with cardiology and psychiatry ?- Return for follow-up in 4 weeks ?

## 2021-12-18 NOTE — Assessment & Plan Note (Signed)
Longstanding history of the same, acutely worsened following significant life stressor where he witnessed the passing of his younger cousin unexpectedly due to medical causes.  He was previously dosing buspirone without significant response, have advised both initiation of SNRI as well as referral to psychiatry.  We will recheck symptoms in 4 weeks, he was advised to contact us for any SI/HI which she denies currently. ? ?Chronic condition, symptomatic, Rx management ?

## 2021-12-18 NOTE — Progress Notes (Signed)
?  ? ?Primary Care / Sports Medicine Office Visit ? ?Patient Information:  ?Patient ID: Scott Silva, male DOB: 06/22/2001 Age: 21 y.o. MRN: [id number]  ? ?Scott Silva is a pleasant 21 y.o. male presenting with the following: ? ?Chief Complaint  ?Patient presents with  ? New Patient (Initial Visit)  ? Establish Care  ? Chest Pain  ?  X2 months  ? Anxiety  ?  X2 months  ? ? ?Vitals:  ? 12/18/21 1356  ?BP: 114/72  ?Pulse: 63  ?SpO2: 98%  ? ?Vitals:  ? 12/18/21 1356  ?Weight: 189 lb (85.7 kg)  ?Height: 5\' 6"  (1.676 m)  ? ?Body mass index is 30.51 kg/m?. ? ?No results found.  ? ?Independent interpretation of notes and tests performed by another provider:  ? ?None ? ?Procedures performed:  ? ?None ? ?Pertinent History, Exam, Impression, and Recommendations:  ? ?GERD (gastroesophageal reflux disease) ?Condition, previously managed with omeprazole and famotidine.  Given his stated history of chest pain, this may represent a component of the underlying etiology.  To further evaluate this from a diagnostic and therapeutic standpoint, I have advised transition to pantoprazole 40 mg daily on an empty stomach until his return. ? ?Chronic condition, symptomatic, Rx management ? ?Depression with anxiety ?Longstanding history of the same, acutely worsened following significant life stressor where he witnessed the passing of his younger cousin unexpectedly due to medical causes.  He was previously dosing buspirone without significant response, have advised both initiation of SNRI as well as referral to psychiatry.  We will recheck symptoms in 4 weeks, he was advised to contact for any SI/HI which she denies currently. ? ?Chronic condition, symptomatic, Rx management ? ?Chest pain ?Noted over the past 2 months in the setting of reflux and anxiety/depression.  Onset noted after witnessed of younger family member passing away due to unexpected medical condition.  EKG in office obtained today shows sinus bradycardia, he has  been dosing OTC anxiety medications.  From a cardiopulmonary examination standpoint he has tenderness at the right inferior chest wall, this is where he localizes his chest pain to, he has positive S1 and S2, and regular rate appreciated on my exam, regular rhythm, no additional heart sounds, he has clear lung fields throughout without wheezes, rales, rhonchi, no JVD, no carotid bruits, symmetric pulses in the extremities, no peripheral edema. ? ?Various etiologies of chest pain reviewed, concern for possible cardiac etiology, additional etiologies to be considered given his history and examination findings can be musculoskeletal from the right costochondral margin inferiorly, gastroenterology related given his history of GERD, psychiatric ongoing anxiety/depression.  We discussed further evaluation management steps, plan for risk stratification labs, referral to cardiology, although while treating the additional components.  Plan for close follow-up in 4 weeks.  ? ?Orders & Medications ?Meds ordered this encounter  ?Medications  ? venlafaxine XR (EFFEXOR XR) 75 MG 24 hr capsule  ?  Sig: Take 1 capsule (75 mg total) by mouth daily with breakfast.  ?  Dispense:  30 capsule  ?  Refill:  1  ? hydrOXYzine (VISTARIL) 25 MG capsule  ?  Sig: Take 1 capsule (25 mg total) by mouth every 8 (eight) hours as needed.  ?  Dispense:  30 capsule  ?  Refill:  0  ? pantoprazole (PROTONIX) 40 MG tablet  ?  Sig: Take 1 tablet (40 mg total) by mouth daily.  ?  Dispense:  30 tablet  ?  Refill:  3  ? ?Orders  Placed This Encounter  ?Procedures  ? Apo A1 + B + Ratio  ? CBC  ? Comprehensive metabolic panel  ? Lipid panel  ? TSH  ? Ambulatory referral to Psychiatry  ? Ambulatory referral to Cardiology  ? EKG 12-Lead  ?  ? ?Return in about 4 weeks (around 01/15/2022).  ?  ? ?Jerrol Banana, MD ? ? Primary Care Sports Medicine ?[facility] ?[facility]  ? ?

## 2021-12-18 NOTE — Assessment & Plan Note (Signed)
Noted over the past 2 months in the setting of reflux and anxiety/depression.  Onset noted after witnessed of younger family member passing away due to unexpected medical condition.  EKG in office obtained today shows sinus bradycardia, he has been dosing OTC anxiety medications.  From a cardiopulmonary examination standpoint he has tenderness at the right inferior chest wall, this is where he localizes his chest pain to, he has positive S1 and S2, and regular rate appreciated on my exam, regular rhythm, no additional heart sounds, he has clear lung fields throughout without wheezes, rales, rhonchi, no JVD, no carotid bruits, symmetric pulses in the extremities, no peripheral edema. ? ?Various etiologies of chest pain reviewed, concern for possible cardiac etiology, additional etiologies to be considered given his history and examination findings can be musculoskeletal from the right costochondral margin inferiorly, gastroenterology related given his history of GERD, psychiatric ongoing anxiety/depression.  We discussed further evaluation management steps, plan for risk stratification labs, referral to cardiology, although while treating the additional components.  Plan for close follow-up in 4 weeks. ?

## 2021-12-19 ENCOUNTER — Ambulatory Visit: Payer: Self-pay

## 2021-12-19 ENCOUNTER — Encounter: Payer: Self-pay | Admitting: Family Medicine

## 2021-12-19 NOTE — Telephone Encounter (Signed)
?  Chief Complaint: chest pain ?Symptoms: L sided chest pain 8/10 radiates into L arm, headache ?Frequency: today started since 930 ?Pertinent Negatives: Patient denies SOB or dizziness ?Disposition: [x] ED /[] Urgent Care (no appt availability in office) / [] Appointment(In office/virtual)/ []  [place] [facility]/ [] Home Care/ [] Refused Recommended Disposition /[] [place] [facility]/ []  Follow-up with PCP ?Additional Notes: pt had gotten off work and was driving but pulled over d/t head and hurting. Advised pt if he felt same to drive home and see if his mom could take him to ED and if not could call or call office back and we call for him.  ? ? ?Reason for Disposition ? [1] Chest pain lasts > 5 minutes AND [2] occurred in past 3 days (72 hours) (Exception: feels exactly the same as previously diagnosed heartburn and has accompanying sour taste in mouth) ? ?Answer Assessment - Initial Assessment Questions ?1. LOCATION: "Where does it hurt?"   ?    L sided chest pain ?2. RADIATION: "Does the pain go anywhere else?" (e.g., into neck, jaw, arms, back) ?    L arm  ?3. ONSET: "When did the chest pain begin?" (Minutes, hours or days)  ?    today ?4. PATTERN "Does the pain come and go, or has it been constant since it started?"  "Does it get worse with exertion?"  ?    constant ?6. SEVERITY: "How bad is the pain?"  (e.g., Scale 1-10; mild, moderate, or severe) ?   - MILD (1-3): doesn't interfere with normal activities  ?   - MODERATE (4-7): interferes with normal activities or awakens from sleep ?   - SEVERE (8-10): excruciating pain, unable to do any normal activities   ?    8 ?10. OTHER SYMPTOMS: "Do you have any other symptoms?" (e.g., dizziness, nausea, vomiting, sweating, fever, difficulty breathing, cough) ?      Tension headache ? ?Protocols used: Chest Pain-A-AH ? ?

## 2021-12-19 NOTE — Telephone Encounter (Signed)
Patient called yesterday after hours and was triaged by RN Rayford Halsted.  Triage reads: ? ?Caller states he was seen today for chest pain, depression, anxiety, and acid reflux.  States he is having questions on when he should start his Hydroxyzine Pam Q 8 hours as needed, Venlafaxine XR once in morning, and Pantoprazole once in the morning that was called in.  RN told caller to take the medication as prescribed.  Caller also mentions he is having a "tension headache" of 7/10.  Reports he took Ecedrin a few minutes ago. ?

## 2021-12-21 ENCOUNTER — Other Ambulatory Visit: Payer: Self-pay

## 2021-12-21 ENCOUNTER — Emergency Department: Payer: BC Managed Care – PPO

## 2021-12-21 ENCOUNTER — Emergency Department
Admission: EM | Admit: 2021-12-21 | Discharge: 2021-12-21 | Disposition: A | Discharge status: Home / Self Care | Payer: BC Managed Care – PPO | Attending: Emergency Medicine | Admitting: Emergency Medicine

## 2021-12-21 ENCOUNTER — Encounter: Payer: Self-pay | Admitting: Emergency Medicine

## 2021-12-21 ENCOUNTER — Ambulatory Visit: Payer: Self-pay | Admitting: *Deleted

## 2021-12-21 DIAGNOSIS — R072 Precordial pain: Secondary | ICD-10-CM | POA: Diagnosis present

## 2021-12-21 DIAGNOSIS — R0789 Other chest pain: Secondary | ICD-10-CM | POA: Insufficient documentation

## 2021-12-21 LAB — BASIC METABOLIC PANEL
Anion gap: 10 (ref 5–15)
BUN: 14 mg/dL (ref 6–20)
CO2: 25 mmol/L (ref 22–32)
Calcium: 9.6 mg/dL (ref 8.9–10.3)
Chloride: 106 mmol/L (ref 98–111)
Creatinine, Ser: 1.13 mg/dL (ref 0.61–1.24)
GFR, Estimated: >60 mL/min (ref >60)
Glucose, Bld: 93 mg/dL (ref 70–99)
Potassium: 4.2 mmol/L (ref 3.5–5.1)
Sodium: 141 mmol/L (ref 135–145)

## 2021-12-21 LAB — CBC
HCT: 43 % (ref 39.0–52.0)
Hemoglobin: 13.5 g/dL (ref 13.0–17.0)
MCH: 26.3 pg (ref 26.0–34.0)
MCHC: 31.4 g/dL (ref 30.0–36.0)
MCV: 83.7 fL (ref 80.0–100.0)
Platelets: 298 10*3/uL (ref 150–400)
RBC: 5.14 MIL/uL (ref 4.22–5.81)
RDW: 13.2 % (ref 11.5–15.5)
WBC: 7.2 10*3/uL (ref 4.0–10.5)
nRBC: 0 % (ref 0.0–0.2)

## 2021-12-21 LAB — TROPONIN I (HIGH SENSITIVITY): Troponin I (High Sensitivity): 4 ng/L (ref <18)

## 2021-12-21 MED ORDER — LIDOCAINE 5 % EX PTCH
1.0000 | MEDICATED_PATCH | CUTANEOUS | Status: DC
  Administered 2021-12-21: 1 via TRANSDERMAL
  Filled 2021-12-21: qty 1

## 2021-12-21 MED ORDER — LIDOCAINE VISCOUS HCL 2 % MT SOLN: 15.0000 mL | Freq: Once | OROMUCOSAL | Status: DC

## 2021-12-21 MED ORDER — ALUM & MAG HYDROXIDE-SIMETH 200-200-20 MG/5ML PO SUSP: 30.0000 mL | Freq: Once | ORAL | Status: DC

## 2021-12-21 MED ORDER — KETOROLAC TROMETHAMINE 30 MG/ML IJ SOLN
30.0000 mg | Freq: Once | INTRAMUSCULAR | Status: AC
  Administered 2021-12-21: 30 mg via INTRAMUSCULAR
  Filled 2021-12-21: qty 1

## 2021-12-21 MED ORDER — LIDOCAINE 5 % EX PTCH: 1.0000 | MEDICATED_PATCH | Freq: Two times a day (BID) | CUTANEOUS | 0 refills | Status: DC

## 2021-12-21 NOTE — ED Triage Notes (Signed)
Pt to ED from home c/o chest pain and bradycardia for several days.  States checked HR at home and was 48, states waiting for phone call back from cardiologist to set up appointment.  Pain to center chest radiating to left chest and back, pain is sharp.  Denies SOB, but has been nauseous.  Pt A&Ox4, chest rise even and unlabored, skin WNL and in NAD at this time. ?

## 2021-12-21 NOTE — Discharge Instructions (Signed)
Use Tylenol for pain and fevers.  Up to 1000 mg per dose, up to 4 times per day.  Do not take more than 4000 mg of Tylenol/acetaminophen within 24 hours.. ° °Please use lidocaine patches at your site of pain.  Apply 1 patch at a time, leave on for 12 hours, then remove for 12 hours.  12 hours on, 12 hours off.  Do not apply more than 1 patch at a time. ° °

## 2021-12-21 NOTE — Telephone Encounter (Signed)
?  Chief Complaint: medication reaction ?Symptoms: heart racing ?Frequency: after took medication Effexor XR ?Pertinent Negatives: Patient denies current symptoms ?Disposition: [] ED /[] Urgent Care (no appt availability in office) / [x] Appointment(In office/virtual)/ []  [place] [facility]/ [] Home Care/ [] Refused Recommended Disposition /[] [place] [facility]/ []  Follow-up with PCP ?Additional Notes: Pt felt like heart racing after taking med Effexor. He is going to hold it until he hears from MD. Appt made for 12/24/21 which was first available. (Seen in ED today heart rate was 49, per his apple watch it was 62 while on phone with me.) ? ? ?Reason for Disposition ? [1] Pharmacy calling with prescription question AND [2] triager unable to answer question ? ?Answer Assessment - Initial Assessment Questions ?1. NAME of MEDICATION: "What medicine are you calling about?" ?    Efexxor XL ?2. QUESTION: "What is your question?" (e.g., double dose of medicine, side effect) ?    3 days ?3. PRESCRIBING HCP: "Who prescribed it?" Reason: if prescribed by specialist, call should be referred to that group. ?    Dr. Zigmund Daniel ?4. SYMPTOMS: "Do you have any symptoms?" ?    Headache, dehydration, feels like heart racing. ?5. SEVERITY: If symptoms are present, ask "Are they mild, moderate or severe?" ?    moderate ?6. PREGNANCY:  "Is there any chance that you are pregnant?" "When was your last menstrual period?" ?    na ? ?Protocols used: Medication Question Call-A-AH ? ?

## 2021-12-21 NOTE — ED Provider Notes (Signed)
? ?[facility] ?Provider Note ? ? ? Event Date/Time  ? First MD Initiated Contact with Patient 12/21/21 0448   ?  (approximate) ? ? ?History  ? ?Chest Pain ? ? ?HPI ? ?Scott Silva is a 21 y.o. male who presents to the ED for evaluation of Chest Pain ?  ?I review outpatient PCP visit from 3/7 where patient was evaluated for chest pain and anxiety for 2 months. ? ?Patient presents to the ED for evaluation of chest pain over the past 2 days.  He reports sharp substernal and left-sided chest pain, worse with movements.  Denies changes to p.o. intake or toileting, denies dyspnea, cough fever, falls or syncope.  Reports he is worried that his heart rate often in the high 40s and low 50s. ? ? ?Physical Exam  ? ?Triage Vital Signs: ?ED Triage Vitals  ?Enc Vitals Group  ?   BP 12/21/21 0403 130/86  ?   Pulse Rate 12/21/21 0403 (!) 49  ?   Resp 12/21/21 0403 16  ?   Temp 12/21/21 0403 98.9 ?F (37.2 ?C)  ?   Temp Source 12/21/21 0403 Oral  ?   SpO2 12/21/21 0403 100 %  ?   Weight 12/21/21 0339 189 lb (85.7 kg)  ?   Height 12/21/21 0339 5\' 9"  (1.753 m)  ?   Head Circumference --   ?   Peak Flow --   ?   Pain Score 12/21/21 0339 8  ?   Pain Loc --   ?   Pain Edu? --   ?   Excl. in GC? --   ? ? ?Most recent vital signs: ?Vitals:  ? 12/21/21 0403  ?BP: 130/86  ?Pulse: (!) 49  ?Resp: 16  ?Temp: 98.9 ?F (37.2 ?C)  ?SpO2: 100%  ? ? ?General: Awake, no distress.  ?CV:  Good peripheral perfusion. RRR ?Resp:  Normal effort.  CTAB ?Abd:  No distention.  Soft and benign ?MSK:  No deformity noted.  Chest pain is reproducible on palpation without overlying skin changes or signs of trauma. ?Neuro:  No focal deficits appreciated. ?Other:   ? ? ?ED Results / Procedures / Treatments  ? ?Labs ?(all labs ordered are listed, but only abnormal results are displayed) ?Labs Reviewed  ?BASIC METABOLIC PANEL  ?CBC  ?TROPONIN I (HIGH SENSITIVITY)  ?TROPONIN I (HIGH SENSITIVITY)  ? ? ?EKG ?Sinus bradycardia with a rate of 49  bpm.  Normal axis and intervals.  No evidence of acute ischemia. ?Similar to EKGs from 3/7 and 1/21. ? ?RADIOLOGY ?CXR reviewed by me without evidence of acute cardiopulmonary pathology. ? ?Official radiology report(s): ?DG Chest 2 View ? ?Result Date: 12/21/2021 ?CLINICAL DATA:  Chest pain EXAM: CHEST - 2 VIEW COMPARISON:  10/14/2016 FINDINGS: Normal heart size and mediastinal contours. No acute infiltrate or edema. No effusion or pneumothorax. No acute osseous findings. IMPRESSION: Negative chest. Electronically Signed   By: 12/12/2016 M.D.   On: 12/21/2021 04:18   ? ?PROCEDURES and INTERVENTIONS: ? ?.1-3 Lead EKG Interpretation ?Performed by: 02/20/2022, MD ?Authorized by: Delton Prairie, MD  ? ?  Interpretation: normal   ?  ECG rate:  51 ?  ECG rate assessment: normal   ?  Rhythm: sinus rhythm   ?  Ectopy: none   ?  Conduction: normal   ? ?Medications  ?lidocaine (LIDODERM) 5 % 1 patch (1 patch Transdermal Patch Applied 12/21/21 0527)  ?ketorolac (TORADOL) 30 MG/ML injection 30 mg (30 mg  Intramuscular Given 12/21/21 0528)  ? ? ? ?IMPRESSION / MDM / ASSESSMENT AND PLAN / ED COURSE  ?I reviewed the triage vital signs and the nursing notes. ? ?Low risk 21 year old male presents to the ED with chest pain, possibly MSK in etiology, and suitable for outpatient management.  He looks well to me with a reassuring examination.  Chest pain is reproducible and otherwise normal exam.  CXR without infiltrate, PTX or pathology.  EKG is nonischemic and troponin is negative.  Normal metabolic panel and CBC.  Resolving symptoms of her lidocaine patches applied to her site of pain.  Doubt acute cardiopulmonary pathology and more likely to be MSK.  Provided prescription for the lidocaine patches discussed return precautions. ? ?Clinical Course as of 12/21/21 0604  ?Fri Dec 21, 2021  ?1761 Patient reports feeling better. Requesting work note. We discussed care at home and return precautions. [DS]  ?  ?Clinical Course User  Index ?[DS] Delton Prairie, MD  ? ? ? ?FINAL CLINICAL IMPRESSION(S) / ED DIAGNOSES  ? ?Final diagnoses:  ?Other chest pain  ? ? ? ?Rx / DC Orders  ? ?ED Discharge Orders   ? ?      Ordered  ?  lidocaine (LIDODERM) 5 %  Every 12 hours       ? 12/21/21 0545  ? ?  ?  ? ?  ? ? ? ?Note:  This document was prepared using Dragon voice recognition software and may include unintentional dictation errors. ?  ?Delton Prairie, MD ?12/21/21 [phone] ? ?

## 2021-12-24 ENCOUNTER — Telehealth: Payer: BC Managed Care – PPO | Admitting: Family Medicine

## 2022-01-13 ENCOUNTER — Emergency Department
Admission: EM | Admit: 2022-01-13 | Discharge: 2022-01-13 | Disposition: A | Discharge status: Home / Self Care | Payer: BC Managed Care – PPO | Attending: Emergency Medicine | Admitting: Emergency Medicine

## 2022-01-13 ENCOUNTER — Emergency Department: Payer: BC Managed Care – PPO

## 2022-01-13 ENCOUNTER — Encounter: Payer: Self-pay | Admitting: Emergency Medicine

## 2022-01-13 ENCOUNTER — Other Ambulatory Visit: Payer: Self-pay

## 2022-01-13 DIAGNOSIS — R0602 Shortness of breath: Secondary | ICD-10-CM | POA: Diagnosis not present

## 2022-01-13 DIAGNOSIS — R0781 Pleurodynia: Secondary | ICD-10-CM | POA: Diagnosis not present

## 2022-01-13 DIAGNOSIS — R0789 Other chest pain: Secondary | ICD-10-CM | POA: Diagnosis present

## 2022-01-13 DIAGNOSIS — Z20822 Contact with and (suspected) exposure to covid-19: Secondary | ICD-10-CM | POA: Insufficient documentation

## 2022-01-13 DIAGNOSIS — J029 Acute pharyngitis, unspecified: Secondary | ICD-10-CM | POA: Diagnosis not present

## 2022-01-13 LAB — CBC WITH DIFFERENTIAL/PLATELET
Abs Immature Granulocytes: 0.02 10*3/uL (ref 0.00–0.07)
Basophils Absolute: 0.1 10*3/uL (ref 0.0–0.1)
Basophils Relative: 1 %
Eosinophils Absolute: 0.2 10*3/uL (ref 0.0–0.5)
Eosinophils Relative: 3 %
HCT: 41 % (ref 39.0–52.0)
Hemoglobin: 13 g/dL (ref 13.0–17.0)
Immature Granulocytes: 0 %
Lymphocytes Relative: 25 %
Lymphs Abs: 2.1 10*3/uL (ref 0.7–4.0)
MCH: 26.3 pg (ref 26.0–34.0)
MCHC: 31.7 g/dL (ref 30.0–36.0)
MCV: 82.8 fL (ref 80.0–100.0)
Monocytes Absolute: 0.5 10*3/uL (ref 0.1–1.0)
Monocytes Relative: 6 %
Neutro Abs: 5.2 10*3/uL (ref 1.7–7.7)
Neutrophils Relative %: 65 %
Platelets: 279 10*3/uL (ref 150–400)
RBC: 4.95 MIL/uL (ref 4.22–5.81)
RDW: 13.7 % (ref 11.5–15.5)
WBC: 8.1 10*3/uL (ref 4.0–10.5)
nRBC: 0 % (ref 0.0–0.2)

## 2022-01-13 LAB — COMPREHENSIVE METABOLIC PANEL
ALT: 19 U/L (ref 0–44)
AST: 19 U/L (ref 15–41)
Albumin: 4.5 g/dL (ref 3.5–5.0)
Alkaline Phosphatase: 69 U/L (ref 38–126)
Anion gap: 7 (ref 5–15)
BUN: 14 mg/dL (ref 6–20)
CO2: 25 mmol/L (ref 22–32)
Calcium: 9.3 mg/dL (ref 8.9–10.3)
Chloride: 106 mmol/L (ref 98–111)
Creatinine, Ser: 0.99 mg/dL (ref 0.61–1.24)
GFR, Estimated: >60 mL/min (ref >60)
Glucose, Bld: 97 mg/dL (ref 70–99)
Potassium: 3.7 mmol/L (ref 3.5–5.1)
Sodium: 138 mmol/L (ref 135–145)
Total Bilirubin: 0.7 mg/dL (ref 0.3–1.2)
Total Protein: 7.4 g/dL (ref 6.5–8.1)

## 2022-01-13 LAB — GROUP A STREP BY PCR: Group A Strep by PCR: NOT DETECTED

## 2022-01-13 LAB — RESP PANEL BY RT-PCR (FLU A&B, COVID) ARPGX2
Influenza A by PCR: NEGATIVE
Influenza B by PCR: NEGATIVE
SARS Coronavirus 2 by RT PCR: NEGATIVE

## 2022-01-13 LAB — TROPONIN I (HIGH SENSITIVITY): Troponin I (High Sensitivity): 3 ng/L (ref <18)

## 2022-01-13 NOTE — ED Triage Notes (Signed)
Pt reports was at work and started having a sharp pain in his mid chest. Pt reports when the pain hit, he felt SOB. Pt reports seen here for the same recently and was told it was a pulled muscle. ?

## 2022-01-13 NOTE — Discharge Instructions (Addendum)
-  You may take Tylenol and ibuprofen/naproxen as needed for the chest wall pain. ?-Follow-up with your primary care provider if you continue to have worsening pain.  You may return to the emergency department anytime if you begin to experience any new or worsening symptoms. ?-You may treat sore throat with throat lozenges and/or other over-the-counter medications as needed. ?

## 2022-01-13 NOTE — ED Provider Notes (Signed)
? ?[facility] ?Provider Note ? ? ? Event Date/Time  ? First MD Initiated Contact with Patient 01/13/22 1051   ?  (approximate) ? ? ?History  ? ?Chief Complaint ?Chest Pain and Shortness of Breath ? ? ?HPI ?Scott Silva is a 21 y.o. male, history of anxiety/depression, GERD, allergic rhinitis, presents to the emergency department for evaluation of chest pain.  Patient states that he was at work when he suddenly began experiencing a sharp pain in his mid chest, as well as shortness of breath.  He described the pain as a sharp sensation along the mid sternum/right-sided chest, 10 out of 10.  He states that the pain has mostly resolved now.  He states that he has been diagnosed with musculoskeletal chest wall pain in the past. ? ?In addition, patient states that he also has a sore throat and dry cough has been going on for the past 3 days.  He states that he went to [facility] emergency department last night, however he left after waiting for 4 hours.  He states that he feels a scratchy sensation in his throat.  Denies fever/chills, abdominal pain, flank pain, urinary symptoms, rashes, lightheadedness/dizziness, or difficulty swallowing. ? ?History Limitations: No limitations. ? ?  ? ? ?Physical Exam  ?Triage Vital Signs: ?ED Triage Vitals  ?Enc Vitals Group  ?   BP 01/13/22 1044 131/61  ?   Pulse Rate 01/13/22 1044 (!) 58  ?   Resp 01/13/22 1044 16  ?   Temp 01/13/22 1044 98.7 ?F (37.1 ?C)  ?   Temp Source 01/13/22 1044 Oral  ?   SpO2 01/13/22 1044 95 %  ?   Weight 01/13/22 1040 188 lb (85.3 kg)  ?   Height 01/13/22 1040 5\' 9"  (1.753 m)  ?   Head Circumference --   ?   Peak Flow --   ?   Pain Score 01/13/22 1040 7  ?   Pain Loc --   ?   Pain Edu? --   ?   Excl. in GC? --   ? ? ?Most recent vital signs: ?Vitals:  ? 01/13/22 1044  ?BP: 131/61  ?Pulse: (!) 58  ?Resp: 16  ?Temp: 98.7 ?F (37.1 ?C)  ?SpO2: 95%  ? ? ?General: Awake, NAD.  ?Skin: Warm, dry.  ?CV: Good peripheral perfusion.  ?Resp: Normal  effort.  Lung sounds are clear bilaterally in the apices and bases. ?Abd: Soft, non-tender. No distention.  ?Neuro: At baseline. No gross neurological deficits.  ?Other: Throat is mildly erythematous on exam.  No tonsillar swelling or exudates.  Uvula midline.  Patient does have notable chest wall pain, particular around the right second and third rib just lateral to the sternum. ? ?Physical Exam ? ? ? ?ED Results / Procedures / Treatments  ?Labs ?(all labs ordered are listed, but only abnormal results are displayed) ?Labs Reviewed  ?RESP PANEL BY RT-PCR (FLU A&B, COVID) ARPGX2  ?GROUP A STREP BY PCR  ?CBC WITH DIFFERENTIAL/PLATELET  ?COMPREHENSIVE METABOLIC PANEL  ?TROPONIN I (HIGH SENSITIVITY)  ? ? ? ?EKG ?Sinus rhythm, rate of 58, no AV blocks, normal QRS interval, no ST segment changes. ? ? ?RADIOLOGY ? ?ED Provider Interpretation: I personally reviewed and interpreted this chest x-ray.  No evidence of acute cardiopulmonary pathology. ? ?DG Chest 1 View ? ?Result Date: 01/13/2022 ?CLINICAL DATA:  Chest pain and shortness of breath. EXAM: CHEST  1 VIEW COMPARISON:  12/21/2021 FINDINGS: 1108 hours. The lungs are clear without  focal pneumonia, edema, pneumothorax or pleural effusion. The cardiopericardial silhouette is within normal limits for size. The visualized bony structures of the thorax are unremarkable. IMPRESSION: No active disease. Electronically Signed   By: Kennith Center M.D.   On: 01/13/2022 11:34   ? ?PROCEDURES: ? ?Critical Care performed: None. ? ?Procedures ? ? ? ?MEDICATIONS ORDERED IN ED: ?Medications - No data to display ? ? ?IMPRESSION / MDM / ASSESSMENT AND PLAN / ED COURSE  ?I reviewed the triage vital signs and the nursing notes. ?             ?               ? ? ?Differential diagnosis includes, but is not limited to, ACS, costochondritis, myocarditis/pericarditis, influenza, COVID-19, strep pharyngitis, viral URI, bronchitis. ? ?ED Course ?Patient appears well.  Vital signs within normal  limits.  NAD. ? ?CBC shows no evidence of leukocytosis or anemia.  CMP unremarkable. ? ?EKG unremarkable.  Initial troponin unremarkable at 3.  Unlikely ACS or myocarditis/peritonitis. ? ?Respiratory panel negative for COVID-19 or influenza.  Strep PCR negative ? ?Chest x-ray shows no acute findings ? ?Assessment/Plan ?Presentation consistent with costochondritis versus anxiety as the source of the chest pain.  Work-up has been unremarkable.  Very unlikely ACS, PE, or other serious pathology given his history and physical exam.  He is PERC negative.  In regards to his sore throat and dry cough, likely viral URI.  Encouraged him to utilize over-the-counter medications as needed.  We will plan to discharge this patient.  Encouraged him to follow-up with his primary care provider as needed ? ?Considered admission for this patient, but given his stable presentation, normal vitals, and unremarkable work-up, he is unlikely to benefit. ? ?Patient was provided with anticipatory guidance, return precautions, and educational material. Encouraged the patient to return to the emergency department at any time if they begin to experience any new or worsening symptoms.  ? ?  ? ? ?FINAL CLINICAL IMPRESSION(S) / ED DIAGNOSES  ? ?Final diagnoses:  ?Chest wall pain  ? ? ? ?Rx / DC Orders  ? ?ED Discharge Orders   ? ? None  ? ?  ? ? ? ?Note:  This document was prepared using Dragon voice recognition software and may include unintentional dictation errors. ?  ?Varney Daily, [place] ?01/14/22 5374 ? ?  ?Georga Hacking, MD ?01/14/22 1622 ? ?

## 2022-01-13 NOTE — ED Notes (Signed)
Patient declined discharge vital signs. 

## 2022-01-14 ENCOUNTER — Encounter: Payer: Self-pay | Admitting: Family Medicine

## 2022-01-15 ENCOUNTER — Encounter: Payer: Self-pay | Admitting: Family Medicine

## 2022-01-15 ENCOUNTER — Ambulatory Visit: Payer: BC Managed Care – PPO | Admitting: Family Medicine

## 2022-01-15 VITALS — BP 120/78 | HR 76 | Ht 69.0 in | Wt 191.0 lb

## 2022-01-15 DIAGNOSIS — F418 Other specified anxiety disorders: Secondary | ICD-10-CM | POA: Diagnosis not present

## 2022-01-15 DIAGNOSIS — R079 Chest pain, unspecified: Secondary | ICD-10-CM

## 2022-01-15 DIAGNOSIS — M940 Chondrocostal junction syndrome [Tietze]: Secondary | ICD-10-CM | POA: Diagnosis not present

## 2022-01-15 LAB — COMPREHENSIVE METABOLIC PANEL
ALT: 16 IU/L (ref 0–44)
AST: 14 IU/L (ref 0–40)
Albumin/Globulin Ratio: 1.7 (ref 1.2–2.2)
Albumin: 4.7 g/dL (ref 4.1–5.2)
Alkaline Phosphatase: 91 IU/L (ref 44–121)
BUN/Creatinine Ratio: 16 (ref 9–20)
BUN: 16 mg/dL (ref 6–20)
Bilirubin Total: <0.2 mg/dL (ref 0.0–1.2)
CO2: 23 mmol/L (ref 20–29)
Calcium: 9.7 mg/dL (ref 8.7–10.2)
Chloride: 102 mmol/L (ref 96–106)
Creatinine, Ser: 1.03 mg/dL (ref 0.76–1.27)
Globulin, Total: 2.8 g/dL (ref 1.5–4.5)
Glucose: 66 mg/dL — ABNORMAL LOW (ref 70–99)
Potassium: 4.3 mmol/L (ref 3.5–5.2)
Sodium: 140 mmol/L (ref 134–144)
Total Protein: 7.5 g/dL (ref 6.0–8.5)
eGFR: 105 mL/min/{1.73_m2} (ref >59)

## 2022-01-15 LAB — CBC
Hematocrit: 44.7 % (ref 37.5–51.0)
Hemoglobin: 14.2 g/dL (ref 13.0–17.7)
MCH: 26.9 pg (ref 26.6–33.0)
MCHC: 31.8 g/dL (ref 31.5–35.7)
MCV: 85 fL (ref 79–97)
Platelets: 282 10*3/uL (ref 150–450)
RBC: 5.28 x10E6/uL (ref 4.14–5.80)
RDW: 13.4 % (ref 11.6–15.4)
WBC: 8.4 10*3/uL (ref 3.4–10.8)

## 2022-01-15 LAB — APO A1 + B + RATIO
Apolipo. B/A-1 Ratio: 0.7 ratio (ref 0.0–0.7)
Apolipoprotein A-1: 111 mg/dL (ref 101–178)
Apolipoprotein B: 73 mg/dL (ref <90)

## 2022-01-15 LAB — TSH: TSH: 0.48 u[IU]/mL (ref 0.450–4.500)

## 2022-01-15 LAB — LIPID PANEL
Chol/HDL Ratio: 3.7 ratio (ref 0.0–5.0)
Cholesterol, Total: 131 mg/dL (ref 100–199)
HDL: 35 mg/dL — ABNORMAL LOW (ref >39)
LDL Chol Calc (NIH): 78 mg/dL (ref 0–99)
Triglycerides: 96 mg/dL (ref 0–149)
VLDL Cholesterol Cal: 18 mg/dL (ref 5–40)

## 2022-01-15 MED ORDER — VENLAFAXINE HCL ER 150 MG PO CP24: 150.0000 mg | ORAL_CAPSULE | Freq: Every day | ORAL | 0 refills | Status: DC

## 2022-01-15 MED ORDER — MELOXICAM 15 MG PO TABS: 15.0000 mg | ORAL_TABLET | Freq: Every day | ORAL | 0 refills | Status: DC

## 2022-01-15 NOTE — Assessment & Plan Note (Signed)
Persistent symptomatology endorsed subjectively, that being said his GAD scores have significantly dropped, plan for further titration of Effexor, a referral to psychiatry has been placed at last visit, we have discussed ways to pursue that follow-up. ?

## 2022-01-15 NOTE — Progress Notes (Signed)
?  ? ?  Primary Care / Sports Medicine Office Visit ? ?Patient Information:  ?Patient ID: Scott Silva, male DOB: 01-May-2001 Age: 21 y.o. MRN: [id number]  ? ?Scott Silva is a pleasant 21 y.o. male presenting with the following: ? ?Chief Complaint  ?Patient presents with  ? Follow-up  ?  Pt here for follow up on chest pain, not really having pain now, just staying sleepy all the time.   ? ? ?Vitals:  ? 01/15/22 1345  ?BP: 120/78  ?Pulse: 76  ?SpO2: 98%  ? ?Vitals:  ? 01/15/22 1345  ?Weight: 191 lb (86.6 kg)  ?Height: 5\' 9"  (1.753 m)  ? ?Body mass index is 28.21 kg/m?. ? ?DG Chest 1 View ? ?Result Date: 01/13/2022 ?CLINICAL DATA:  Chest pain and shortness of breath. EXAM: CHEST  1 VIEW COMPARISON:  12/21/2021 FINDINGS: 1108 hours. The lungs are clear without focal pneumonia, edema, pneumothorax or pleural effusion. The cardiopericardial silhouette is within normal limits for size. The visualized bony structures of the thorax are unremarkable. IMPRESSION: No active disease. Electronically Signed   By: Misty Stanley M.D.   On: 01/13/2022 11:34  ? ?DG Chest 2 View ? ?Result Date: 12/21/2021 ?CLINICAL DATA:  Chest pain EXAM: CHEST - 2 VIEW COMPARISON:  10/14/2016 FINDINGS: Normal heart size and mediastinal contours. No acute infiltrate or edema. No effusion or pneumothorax. No acute osseous findings. IMPRESSION: Negative chest. Electronically Signed   By: Jorje Guild M.D.   On: 12/21/2021 04:18    ? ?Independent interpretation of notes and tests performed by another provider:  ? ?None ? ?Procedures performed:  ? ?None ? ?Pertinent History, Exam, Impression, and Recommendations:  ? ?Costochondritis ?Since last visit he has been to ER twice, concern for costochondritis raised, patient is uncertain what this diagnosis means or possible treatments.  He has been using OTC Aspercreme with some positive benefit. ? ?Examination does reveal tenderness along the left greater than right sternal border which does recreate his  stated symptomatology.  Given his lack of response from scheduled PPI, I have advised scheduled meloxicam, formal PT to work on postural mechanics and stretching throughout the pectoralis, strengthening of the medial scapular border musculature, and we will coordinate a follow-up accordingly. ? ?Depression with anxiety ?Persistent symptomatology endorsed subjectively, that being said his GAD scores have significantly dropped, plan for further titration of Effexor, a referral to psychiatry has been placed at last visit, we have discussed ways to pursue that follow-up. ? ?Chest wall pain ?Risk stratification labs overall reassuring, cardiology referral pending, no response from PPI, this was discontinued, will await cardiology input.  Over the interim we will treat for musculoskeletal and psychiatric etiologies for stated symptomatology.  ? ?Orders & Medications ?Meds ordered this encounter  ?Medications  ? venlafaxine XR (EFFEXOR XR) 150 MG 24 hr capsule  ?  Sig: Take 1 capsule (150 mg total) by mouth daily with breakfast.  ?  Dispense:  90 capsule  ?  Refill:  0  ? meloxicam (MOBIC) 15 MG tablet  ?  Sig: Take 1 tablet (15 mg total) by mouth daily.  ?  Dispense:  30 tablet  ?  Refill:  0  ? ?Orders Placed This Encounter  ?Procedures  ? Ambulatory referral to Physical Therapy  ?  ? ?Return in about 8 weeks (around 03/12/2022).  ?  ? ?Montel Culver, MD ? ? Primary Care Sports Medicine ?[facility] ?[place]  ? ?

## 2022-01-15 NOTE — Assessment & Plan Note (Signed)
Since last visit he has been to ER twice, concern for costochondritis raised, patient is uncertain what this diagnosis means or possible treatments.  He has been using OTC Aspercreme with some positive benefit. ? ?Examination does reveal tenderness along the left greater than right sternal border which does recreate his stated symptomatology.  Given his lack of response from scheduled PPI, I have advised scheduled meloxicam, formal PT to work on postural mechanics and stretching throughout the pectoralis, strengthening of the medial scapular border musculature, and we will coordinate a follow-up accordingly. ?

## 2022-01-15 NOTE — Assessment & Plan Note (Signed)
Risk stratification labs overall reassuring, cardiology referral pending, no response from PPI, this was discontinued, will await cardiology input.  Over the interim we will treat for musculoskeletal and psychiatric etiologies for stated symptomatology. ?

## 2022-01-15 NOTE — Patient Instructions (Addendum)
-   Referral coordinator will contact you regarding physical therapy scheduling ?- Contact psychiatry and cardiology to confirm visits ?- Take new dose of Effexor 150 mg (venlafaxine) daily ?- Take hydroxyzine (Vistaril) as-needed for anxiety/chest pain ?- Return in 2 months  ? ?PSY- [phone] ? ?[phone] ?

## 2022-02-10 ENCOUNTER — Encounter: Payer: Self-pay | Admitting: Family Medicine

## 2022-02-12 ENCOUNTER — Other Ambulatory Visit: Payer: Self-pay | Admitting: Family Medicine

## 2022-02-12 DIAGNOSIS — M940 Chondrocostal junction syndrome [Tietze]: Secondary | ICD-10-CM

## 2022-02-12 NOTE — Telephone Encounter (Signed)
Ok to refill 

## 2022-02-12 NOTE — Telephone Encounter (Signed)
Requested medication (s) are due for refill today - yes ? ?Requested medication (s) are on the active medication list -yes ? ?Future visit scheduled -yes ? ?Last refill: 01/15/22 #30 ? ?Notes to clinic: Request RF: original Rx written without RF- sent for review  ? ?Requested Prescriptions  ?Pending Prescriptions Disp Refills  ? meloxicam (MOBIC) 15 MG tablet [Pharmacy Med Name: MELOXICAM 15 MG TABLET] 30 tablet 0  ?  Sig: Take 1 tablet (15 mg total) by mouth daily.  ?  ? Analgesics:  COX2 Inhibitors Failed - 02/12/2022  2:26 AM  ?  ?  Failed - Manual Review: Labs are only required if the patient has taken medication for more than 8 weeks.  ?  ?  Passed - HGB in normal range and within 360 days  ?  Hemoglobin  ?Date Value Ref Range Status  ?01/14/2022 14.2 13.0 - 17.7 g/dL Final  ?  ?  ?  ?  Passed - Cr in normal range and within 360 days  ?  Creatinine, Ser  ?Date Value Ref Range Status  ?01/14/2022 1.03 0.76 - 1.27 mg/dL Final  ?  ?  ?  ?  Passed - HCT in normal range and within 360 days  ?  Hematocrit  ?Date Value Ref Range Status  ?01/14/2022 44.7 37.5 - 51.0 % Final  ?  ?  ?  ?  Passed - AST in normal range and within 360 days  ?  AST  ?Date Value Ref Range Status  ?01/14/2022 14 0 - 40 IU/L Final  ?  ?  ?  ?  Passed - ALT in normal range and within 360 days  ?  ALT  ?Date Value Ref Range Status  ?01/14/2022 16 0 - 44 IU/L Final  ?  ?  ?  ?  Passed - eGFR is 30 or above and within 360 days  ?  GFR, Estimated  ?Date Value Ref Range Status  ?01/13/2022 >60 >60 mL/min Final  ?  Comment:  ?  (NOTE) ?Calculated using the CKD-EPI Creatinine Equation (2021) ?  ? ?eGFR  ?Date Value Ref Range Status  ?01/14/2022 105 >59 mL/min/1.73 Final  ?  ?  ?  ?  Passed - Patient is not pregnant  ?  ?  Passed - Valid encounter within last 12 months  ?  Recent Outpatient Visits   ? ?      ? 4 weeks ago Costochondritis  ? [facility] Montel Culver, MD  ? 1 month ago Chest pain, unspecified type  ? [facility]  Montel Culver, MD  ? ?  ?  ?Future Appointments   ? ?        ? In 2 days Agbor-Etang, Aaron Edelman, MD [facility]  ? In 4 weeks Zigmund Daniel Earley Abide, MD [facility] [place]  ? ?  ? ? ?  ?  ?  ? ? ? ?Requested Prescriptions  ?Pending Prescriptions Disp Refills  ? meloxicam (MOBIC) 15 MG tablet [Pharmacy Med Name: MELOXICAM 15 MG TABLET] 30 tablet 0  ?  Sig: Take 1 tablet (15 mg total) by mouth daily.  ?  ? Analgesics:  COX2 Inhibitors Failed - 02/12/2022  2:26 AM  ?  ?  Failed - Manual Review: Labs are only required if the patient has taken medication for more than 8 weeks.  ?  ?  Passed - HGB in normal range and within 360 days  ?  Hemoglobin  ?  Date Value Ref Range Status  ?01/14/2022 14.2 13.0 - 17.7 g/dL Final  ?  ?  ?  ?  Passed - Cr in normal range and within 360 days  ?  Creatinine, Ser  ?Date Value Ref Range Status  ?01/14/2022 1.03 0.76 - 1.27 mg/dL Final  ?  ?  ?  ?  Passed - HCT in normal range and within 360 days  ?  Hematocrit  ?Date Value Ref Range Status  ?01/14/2022 44.7 37.5 - 51.0 % Final  ?  ?  ?  ?  Passed - AST in normal range and within 360 days  ?  AST  ?Date Value Ref Range Status  ?01/14/2022 14 0 - 40 IU/L Final  ?  ?  ?  ?  Passed - ALT in normal range and within 360 days  ?  ALT  ?Date Value Ref Range Status  ?01/14/2022 16 0 - 44 IU/L Final  ?  ?  ?  ?  Passed - eGFR is 30 or above and within 360 days  ?  GFR, Estimated  ?Date Value Ref Range Status  ?01/13/2022 >60 >60 mL/min Final  ?  Comment:  ?  (NOTE) ?Calculated using the CKD-EPI Creatinine Equation (2021) ?  ? ?eGFR  ?Date Value Ref Range Status  ?01/14/2022 105 >59 mL/min/1.73 Final  ?  ?  ?  ?  Passed - Patient is not pregnant  ?  ?  Passed - Valid encounter within last 12 months  ?  Recent Outpatient Visits   ? ?      ? 4 weeks ago Costochondritis  ? [facility] Montel Culver, MD  ? 1 month ago Chest pain, unspecified type  ? [facility] Montel Culver, MD  ? ?  ?  ?Future  Appointments   ? ?        ? In 2 days Agbor-Etang, Aaron Edelman, MD [facility]  ? In 4 weeks Zigmund Daniel Earley Abide, MD [place]  ? ?  ? ? ?  ?  ?  ? ? ?m ?

## 2022-02-14 ENCOUNTER — Ambulatory Visit: Payer: BC Managed Care – PPO | Admitting: Cardiology

## 2022-02-18 ENCOUNTER — Ambulatory Visit: Payer: BC Managed Care – PPO | Admitting: Family Medicine

## 2022-02-18 ENCOUNTER — Encounter: Payer: Self-pay | Admitting: Family Medicine

## 2022-02-18 VITALS — BP 120/70 | HR 97 | Ht 69.0 in | Wt 196.0 lb

## 2022-02-18 DIAGNOSIS — Z91010 Allergy to peanuts: Secondary | ICD-10-CM

## 2022-02-18 DIAGNOSIS — F418 Other specified anxiety disorders: Secondary | ICD-10-CM | POA: Diagnosis not present

## 2022-02-18 DIAGNOSIS — M940 Chondrocostal junction syndrome [Tietze]: Secondary | ICD-10-CM | POA: Diagnosis not present

## 2022-02-18 MED ORDER — EPINEPHRINE 0.3 MG/0.3ML IJ SOAJ: 0.3000 mg | INTRAMUSCULAR | 2 refills | Status: AC | PRN

## 2022-02-18 NOTE — Assessment & Plan Note (Signed)
Chronic issue without adequate control, at last visit on 01/15/2022 he was advised titration of Effexor, referral to psychiatry.  Unfortunately patient self discontinued Effexor, did not have a chance to see psychiatry but due to work related event had to see work Social worker, has upcoming.  He did note significant benefit from as needed hydroxyzine and is only required this every 3 days compared to daily dosing as he previously required.  I discussed the need to treat his symptoms and other pharmacologic or nonpharmacologic manner, for medication management standpoint we will discontinue the Effexor as he is not amenable to this or any pharmacotherapy at this stage.  Absent return in 3 months we can reassess the need for pharmacologic intervention, can continue with as needed hydroxyzine over the interim. ?

## 2022-02-18 NOTE — Patient Instructions (Signed)
-   Referral coronary will contact in regards to following up with allergy group ?- Use EpiPen as needed, go to ER if having to use EpiPen ?- Use meloxicam as needed for costochondritis pain ?- Continue with counseling/therapy sessions ?- Return for follow-up in 3 months ?

## 2022-02-18 NOTE — Assessment & Plan Note (Signed)
Patient with persistent costal chondral pain, left parasternal region, intermittent in frequency, did dose meloxicam and noted improvement, did not follow through with physical therapy but plans to do so in the future.  Examination shows persistent tenderness at the left parasternal region, right parasternal minimally tender, cardiopulmonary findings benign.  I have encouraged him to transition from scheduled oxycodone to as needed dosing and consider formal PT, can continue home stretches over interim. ?

## 2022-02-18 NOTE — Assessment & Plan Note (Signed)
Stated history of the same, unfortunately patient had unintentional exposure while eating ice cream to peanuts, had shortness of air, chest tightening, this episode spontaneously resolved.  He is desiring EpiPen, I have prescribed this.  Additionally, I placed referral to allergist for further evaluation and management. ?

## 2022-02-18 NOTE — Progress Notes (Signed)
?  ? ?  Primary Care / Sports Medicine Office Visit ? ?Patient Information:  ?Patient ID: Scott Silva, male DOB: [birth year] Age: 21 y.o. MRN: [id number]  ? ?Scott Silva is a pleasant 21 y.o. male presenting with the following: ? ?Chief Complaint  ?Patient presents with  ? Food allergy testing  ?  Pt states he was eating some ice cream and started feeling like his throat was closing up. Would like further testing.   ? ? ?Vitals:  ? 02/18/22 1345  ?BP: 120/70  ?Pulse: 97  ?SpO2: 99%  ? ?Vitals:  ? 02/18/22 1345  ?Weight: 196 lb (88.9 kg)  ?Height: 5\' 9"  (1.753 m)  ? ?Body mass index is 28.94 kg/m?. ? ?No results found.  ? ?Independent interpretation of notes and tests performed by another provider:  ? ?None ? ?Procedures performed:  ? ?None ? ?Pertinent History, Exam, Impression, and Recommendations:  ? ?Problem List Items Addressed This Visit   ? ?  ? Musculoskeletal and Integument  ? Costochondritis  ?  Patient with persistent costal chondral pain, left parasternal region, intermittent in frequency, did dose meloxicam and noted improvement, did not follow through with physical therapy but plans to do so in the future.  Examination shows persistent tenderness at the left parasternal region, right parasternal minimally tender, cardiopulmonary findings benign.  I have encouraged him to transition from scheduled oxycodone to as needed dosing and consider formal PT, can continue home stretches over interim. ? ?  ?  ?  ? Other  ? Depression with anxiety  ?  Chronic issue without adequate control, at last visit on 01/15/2022 he was advised titration of Effexor, referral to psychiatry.  Unfortunately patient self discontinued Effexor, did not have a chance to see psychiatry but due to work related event had to see work 03/17/2022, has upcoming.  He did note significant benefit from as needed hydroxyzine and is only required this every 3 days compared to daily dosing as he previously required.  I discussed the need to  treat his symptoms and other pharmacologic or nonpharmacologic manner, for medication management standpoint we will discontinue the Effexor as he is not amenable to this or any pharmacotherapy at this stage.  Absent return in 3 months we can reassess the need for pharmacologic intervention, can continue with as needed hydroxyzine over the interim. ? ?  ?  ? Peanut allergy - Primary  ?  Stated history of the same, unfortunately patient had unintentional exposure while eating ice cream to peanuts, had shortness of air, chest tightening, this episode spontaneously resolved.  He is desiring EpiPen, I have prescribed this.  Additionally, I placed referral to allergist for further evaluation and management. ? ?  ?  ? Relevant Medications  ? EPINEPHrine (EPIPEN 2-PAK) 0.3 mg/0.3 mL IJ SOAJ injection  ? Other Relevant Orders  ? Ambulatory referral to Allergy  ?  ? ?Orders & Medications ?Meds ordered this encounter  ?Medications  ? EPINEPHrine (EPIPEN 2-PAK) 0.3 mg/0.3 mL IJ SOAJ injection  ?  Sig: Inject 0.3 mg into the muscle as needed for anaphylaxis.  ?  Dispense:  1 each  ?  Refill:  2  ? ?Orders Placed This Encounter  ?Procedures  ? Ambulatory referral to Allergy  ?  ? ?Return in about 3 months (around 05/21/2022).  ?  ? ?07/21/2022, MD ? ? Primary Care Sports Medicine ?[facility] ?[facility]  ? ?

## 2022-03-02 ENCOUNTER — Encounter: Payer: Self-pay | Admitting: Family Medicine

## 2022-03-04 NOTE — Telephone Encounter (Signed)
Please advise 

## 2022-03-04 NOTE — Telephone Encounter (Signed)
Waiting on Angelique Blonder to check on referral, she will get back to [place]. -Zailyn Rowser

## 2022-03-11 ENCOUNTER — Encounter: Payer: Self-pay | Admitting: Family Medicine

## 2022-03-12 ENCOUNTER — Ambulatory Visit: Payer: BC Managed Care – PPO | Admitting: Family Medicine

## 2022-03-12 NOTE — Telephone Encounter (Signed)
Please advise, Referral to Allergy should be calling this week with an appointment

## 2022-03-20 ENCOUNTER — Other Ambulatory Visit: Payer: Self-pay | Admitting: Family Medicine

## 2022-03-20 DIAGNOSIS — M940 Chondrocostal junction syndrome [Tietze]: Secondary | ICD-10-CM

## 2022-03-20 NOTE — Telephone Encounter (Signed)
Requested Prescriptions  Pending Prescriptions Disp Refills  . meloxicam (MOBIC) 15 MG tablet [Pharmacy Med Name: MELOXICAM 15 MG TABLET] 30 tablet 2    Sig: TAKE 1 TABLET (15 MG TOTAL) BY MOUTH DAILY.     Analgesics:  COX2 Inhibitors Failed - 03/20/2022  3:30 AM      Failed - Manual Review: Labs are only required if the patient has taken medication for more than 8 weeks.      Passed - HGB in normal range and within 360 days    Hemoglobin  Date Value Ref Range Status  01/14/2022 14.2 13.0 - 17.7 g/dL Final         Passed - Cr in normal range and within 360 days    Creatinine, Ser  Date Value Ref Range Status  01/14/2022 1.03 0.76 - 1.27 mg/dL Final         Passed - HCT in normal range and within 360 days    Hematocrit  Date Value Ref Range Status  01/14/2022 44.7 37.5 - 51.0 % Final         Passed - AST in normal range and within 360 days    AST  Date Value Ref Range Status  01/14/2022 14 0 - 40 IU/L Final         Passed - ALT in normal range and within 360 days    ALT  Date Value Ref Range Status  01/14/2022 16 0 - 44 IU/L Final         Passed - eGFR is 30 or above and within 360 days    GFR, Estimated  Date Value Ref Range Status  01/13/2022 >60 >60 mL/min Final    Comment:    (NOTE) Calculated using the CKD-EPI Creatinine Equation (2021)    eGFR  Date Value Ref Range Status  01/14/2022 105 >59 mL/min/1.73 Final         Passed - Patient is not pregnant      Passed - Valid encounter within last 12 months    Recent Outpatient Visits          1 month ago Peanut allergy   [facility] Montel Culver, MD   2 months ago [facility] Montel Culver, MD   3 months ago Chest pain, unspecified type   [facility] Montel Culver, MD      Future Appointments            In 3 weeks Agbor-Etang, Aaron Edelman, MD [facility], [facility]   In 2 months Zigmund Daniel, Earley Abide, MD [facility], [facility]

## 2022-04-15 ENCOUNTER — Ambulatory Visit: Payer: BC Managed Care – PPO | Admitting: Cardiology

## 2022-04-17 ENCOUNTER — Encounter: Payer: Self-pay | Admitting: Cardiology

## 2022-04-26 ENCOUNTER — Emergency Department
Admission: EM | Admit: 2022-04-26 | Discharge: 2022-04-26 | Discharge status: Against Medical Advice | Payer: BC Managed Care – PPO

## 2022-05-02 ENCOUNTER — Other Ambulatory Visit: Payer: Self-pay | Admitting: Family Medicine

## 2022-05-02 DIAGNOSIS — F418 Other specified anxiety disorders: Secondary | ICD-10-CM

## 2022-05-03 NOTE — Telephone Encounter (Signed)
Requested Prescriptions  Pending Prescriptions Disp Refills  . hydrOXYzine (VISTARIL) 25 MG capsule [Pharmacy Med Name: HYDROXYZINE PAM 25 MG CAP] 30 capsule 0    Sig: TAKE 1 CAPSULE (25 MG TOTAL) BY MOUTH EVERY 8 (EIGHT) HOURS AS NEEDED.     Ear, Nose, and Throat:  Antihistamines 2 Passed - 05/02/2022 11:12 AM      Passed - Cr in normal range and within 360 days    Creatinine, Ser  Date Value Ref Range Status  01/14/2022 1.03 0.76 - 1.27 mg/dL Final         Passed - Valid encounter within last 12 months    Recent Outpatient Visits          2 months ago Peanut allergy   [facility] Jerrol Banana, MD   3 months ago Costochondritis   [facility] Jerrol Banana, MD   4 months ago Chest pain, unspecified type   [facility] Jerrol Banana, MD      Future Appointments            In 3 months Ashley Royalty, Ocie Bob, MD [facility], [facility]

## 2022-05-21 ENCOUNTER — Ambulatory Visit: Payer: BC Managed Care – PPO | Admitting: Family Medicine

## 2022-05-31 ENCOUNTER — Ambulatory Visit
Admission: EM | Admit: 2022-05-31 | Discharge: 2022-05-31 | Disposition: A | Discharge status: Home / Self Care | Payer: BC Managed Care – PPO | Attending: Physician Assistant | Admitting: Physician Assistant

## 2022-05-31 ENCOUNTER — Ambulatory Visit: Payer: Self-pay | Admitting: *Deleted

## 2022-05-31 DIAGNOSIS — K921 Melena: Secondary | ICD-10-CM | POA: Diagnosis not present

## 2022-05-31 DIAGNOSIS — Z87891 Personal history of nicotine dependence: Secondary | ICD-10-CM | POA: Diagnosis not present

## 2022-05-31 DIAGNOSIS — R110 Nausea: Secondary | ICD-10-CM | POA: Diagnosis not present

## 2022-05-31 DIAGNOSIS — R1012 Left upper quadrant pain: Secondary | ICD-10-CM

## 2022-05-31 DIAGNOSIS — K279 Peptic ulcer, site unspecified, unspecified as acute or chronic, without hemorrhage or perforation: Secondary | ICD-10-CM

## 2022-05-31 DIAGNOSIS — R195 Other fecal abnormalities: Secondary | ICD-10-CM

## 2022-05-31 LAB — COMPREHENSIVE METABOLIC PANEL
ALT: 14 U/L (ref 0–44)
AST: 16 U/L (ref 15–41)
Albumin: 4.7 g/dL (ref 3.5–5.0)
Alkaline Phosphatase: 73 U/L (ref 38–126)
Anion gap: 5 (ref 5–15)
BUN: 16 mg/dL (ref 6–20)
CO2: 26 mmol/L (ref 22–32)
Calcium: 9.5 mg/dL (ref 8.9–10.3)
Chloride: 108 mmol/L (ref 98–111)
Creatinine, Ser: 0.95 mg/dL (ref 0.61–1.24)
GFR, Estimated: >60 mL/min (ref >60)
Glucose, Bld: 98 mg/dL (ref 70–99)
Potassium: 4.5 mmol/L (ref 3.5–5.1)
Sodium: 139 mmol/L (ref 135–145)
Total Bilirubin: <0.1 mg/dL — ABNORMAL LOW (ref 0.3–1.2)
Total Protein: 8.6 g/dL — ABNORMAL HIGH (ref 6.5–8.1)

## 2022-05-31 LAB — CBC WITH DIFFERENTIAL/PLATELET
Abs Immature Granulocytes: 0.03 10*3/uL (ref 0.00–0.07)
Basophils Absolute: 0.1 10*3/uL (ref 0.0–0.1)
Basophils Relative: 1 %
Eosinophils Absolute: 0.2 10*3/uL (ref 0.0–0.5)
Eosinophils Relative: 2 %
HCT: 43.8 % (ref 39.0–52.0)
Hemoglobin: 14 g/dL (ref 13.0–17.0)
Immature Granulocytes: 0 %
Lymphocytes Relative: 24 %
Lymphs Abs: 2 10*3/uL (ref 0.7–4.0)
MCH: 26.6 pg (ref 26.0–34.0)
MCHC: 32 g/dL (ref 30.0–36.0)
MCV: 83.1 fL (ref 80.0–100.0)
Monocytes Absolute: 0.6 10*3/uL (ref 0.1–1.0)
Monocytes Relative: 7 %
Neutro Abs: 5.3 10*3/uL (ref 1.7–7.7)
Neutrophils Relative %: 66 %
Platelets: 285 10*3/uL (ref 150–400)
RBC: 5.27 MIL/uL (ref 4.22–5.81)
RDW: 13.8 % (ref 11.5–15.5)
WBC: 8 10*3/uL (ref 4.0–10.5)
nRBC: 0 % (ref 0.0–0.2)

## 2022-05-31 LAB — LIPASE, BLOOD: Lipase: 29 U/L (ref 11–51)

## 2022-05-31 MED ORDER — ALUM & MAG HYDROXIDE-SIMETH 200-200-20 MG/5ML PO SUSP
30.0000 mL | Freq: Once | ORAL | Status: AC
  Administered 2022-05-31: 30 mL via ORAL

## 2022-05-31 MED ORDER — PANTOPRAZOLE SODIUM 40 MG PO TBEC: 40.0000 mg | DELAYED_RELEASE_TABLET | Freq: Every day | ORAL | 2 refills | Status: AC

## 2022-05-31 MED ORDER — ONDANSETRON 4 MG PO TBDP: 4.0000 mg | ORAL_TABLET | Freq: Three times a day (TID) | ORAL | 0 refills | Status: AC | PRN

## 2022-05-31 NOTE — Telephone Encounter (Signed)
  Chief Complaint: abdominal pain with black stool Symptoms: sharp abdominal pain shoots to chest and back left side. Noted black stool diarrhea today with black stool. Constant pain . Reports taking "gallbladder and liver cleanser" OTC. Poor appetite  Frequency: 1 week for abdominal pain and 3 days for black stool Pertinent Negatives: Patient denies fever, no N/V. Does not feel like passing out  Disposition: [x] ED /[] Urgent Care (no appt availability in office) / [] Appointment(In office/virtual)/ []  [facility]/ [] Home Care/ [] Refused Recommended Disposition /[]  Mobile Bus/ []  Follow-up with PCP Additional Notes:   Patient reports he has been to ED and the wait was so long he came home . No available appt today . Please advise . Not sure if patient will go back to ED.      Reason for Disposition  Black or tarry bowel movements  (Exception: Chronic-unchanged black-grey BMs AND is taking iron pills or Pepto-Bismol.)  Answer Assessment - Initial Assessment Questions 1. LOCATION: "Where does it hurt?"      Left side abdomen 2. RADIATION: "Does the pain shoot anywhere else?" (e.g., chest, back)     Sharp pain goes to chest and back  3. ONSET: "When did the pain begin?" (Minutes, hours or days ago)      1 week  4. SUDDEN: "Gradual or sudden onset?"     na 5. PATTERN "Does the pain come and go, or is it constant?"    - If it comes and goes: "How long does it last?" "Do you have pain now?"     (Note: Comes and goes means the pain is intermittent. It goes away completely between bouts.)    - If constant: "Is it getting better, staying the same, or getting worse?"      (Note: Constant means the pain never goes away completely; most serious pain is constant and gets worse.)      Constant  6. SEVERITY: "How bad is the pain?"  (e.g., Scale 1-10; mild, moderate, or severe)    - MILD (1-3): Doesn't interfere with normal activities, abdomen soft and not tender to touch.     -  MODERATE (4-7): Interferes with normal activities or awakens from sleep, abdomen tender to touch.     - SEVERE (8-10): Excruciating pain, doubled over, unable to do any normal activities.       Getting constant  7. RECURRENT SYMPTOM: "Have you ever had this type of stomach pain before?" If Yes, ask: "When was the last time?" and "What happened that time?"      Went to ED and left due to wait time  8. CAUSE: "What do you think is causing the stomach pain?"     Not sure  9. RELIEVING/AGGRAVATING FACTORS: "What makes it better or worse?" (e.g., antacids, bending or twisting motion, bowel movement)     Pepcid OTC 10. OTHER SYMPTOMS: "Do you have any other symptoms?" (e.g., back pain, diarrhea, fever, urination pain, vomiting)       Diarrhea today black in color , tingles up back  Protocols used: Abdominal Pain - Male-A-AH

## 2022-05-31 NOTE — Telephone Encounter (Signed)
Called pt let him know that he would need to go to the ED for abdominal pain with black stool. If he is worried about the long wait pt can go to UC. Pt verbalized understanding.  KP

## 2022-05-31 NOTE — ED Provider Notes (Addendum)
[facility]    CSN: [id number] Arrival date & time: 05/31/22  1149      History   Chief Complaint Chief Complaint  Patient presents with   Abdominal Pain   Black Stool    HPI Scott Silva is a 21 y.o. male presenting for left upper abdominal pain and epigastric pain with radiation to back as well as dark stools.  Patient reports symptoms have gotten worse over the past 3 days but has been experiencing symptoms for the past 1 week.  He says he has been applying lidocaine patches to his abdomen to try to help the pain.  He reports black stools 2 times a day.  He denies any coffee-ground emesis or bright red blood per rectum or hematemesis.  He denies any breathing difficulty or wheezing.  He does have a history of anxiety and GERD.  Reports that he has not taken any medication for his acid reflux in the past couple of weeks.  He says that he has changed his diet in the past couple weeks and has been mostly eating only fruit.  Patient says that he has had an EGD in the past in 2021 and he denies being informed of any ulcers.  Patient denies frequent alcohol use.  He is a former smoker.  He also does report that he took 15 mg meloxicam daily for 2 weeks from the middle of July to the beginning of August.  Over the past week he reports taking 1 g of Tylenol every 6 hours as well as 400 mg of ibuprofen every 6 hours.  He says he started taking those medications for pain relief of his abdominal pain over the past week.  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 Authorizing Provider  EPINEPHrine (EPIPEN 2-PAK) 0.3 mg/0.3 mL IJ SOAJ injection Inject 0.3 mg into the muscle as needed for anaphylaxis. 02/18/22  Yes Jerrol Banana, MD  fluticasone (FLONASE) 50 MCG/ACT nasal spray Place 1 spray into both nostrils daily. 08/20/18  Yes [provider]  hydrOXYzine (VISTARIL) 25 MG capsule TAKE 1 CAPSULE (25 MG TOTAL) BY MOUTH EVERY 8 (EIGHT) HOURS AS NEEDED. 05/03/22  Yes Jerrol Banana, MD  meloxicam (MOBIC) 15 MG tablet TAKE 1 TABLET (15 MG TOTAL) BY MOUTH DAILY. 03/20/22  Yes Jerrol Banana, MD  ondansetron (ZOFRAN-ODT) 4 MG disintegrating tablet Take 1 tablet (4 mg total) by mouth every 8 (eight) hours as needed for nausea or vomiting. 05/31/22  Yes Eusebio Friendly B, PA-C  pantoprazole (PROTONIX) 40 MG tablet Take 1 tablet (40 mg  total) by mouth daily. 05/31/22 06/30/22 Yes Shirlee Latch, PA-C    Family History Family History  Problem Relation Age of Onset   Migraines Mother    Healthy Father    Anxiety disorder Brother    Syncope episode Brother    Cancer Maternal Grandfather     Social History Social History   Tobacco Use   Smoking status: Former    Types: Cigarettes    Passive exposure: Never   Smokeless tobacco: Never  Vaping Use   Vaping Use: Never used  Substance Use Topics   Alcohol use: Yes   Drug use: Not Currently    Types: Marijuana     Allergies   Peanut-containing drug products and Tomato   Review of Systems Review of Systems  Constitutional:  Negative for appetite  change, fatigue and fever.  Respiratory:  Negative for shortness of breath.   Cardiovascular:  Negative for chest pain.  Gastrointestinal:  Positive for abdominal pain, blood in stool (black stools) and nausea. Negative for anal bleeding, constipation, diarrhea and vomiting.  Genitourinary:  Negative for difficulty urinating, dysuria and frequency.  Musculoskeletal:  Positive for back pain.  Neurological:  Negative for weakness.     Physical Exam Triage Vital Signs ED Triage Vitals  Enc Vitals Group     BP      Pulse      Resp      Temp      Temp src      SpO2      Weight      Height      Head Circumference      Peak Flow      Pain Score      Pain Loc      Pain Edu?      Excl. in GC?    No data found.  Updated Vital Signs BP 122/78 (BP Location: Left Arm)   Pulse 61   Temp 99.1 F (37.3 C) (Oral)   Resp 18   Ht 5\' 9"  (1.753 m)   Wt 190 lb (86.2 kg)   SpO2 96%   BMI 28.06 kg/m    Physical Exam Vitals and nursing note reviewed.  Constitutional:      General: He is not in acute distress.    Appearance: Normal appearance. He is well-developed. He is not ill-appearing.  HENT:     Head: Normocephalic and atraumatic.  Eyes:     General: No scleral icterus.    Conjunctiva/sclera: Conjunctivae normal.  Cardiovascular:     Rate and Rhythm: Normal rate and regular rhythm.     Heart sounds: Normal heart sounds.  Pulmonary:     Effort: Pulmonary effort is normal. No respiratory distress.     Breath sounds: Normal breath sounds.  Abdominal:     Palpations: Abdomen is soft.     Tenderness: There is abdominal tenderness in the epigastric area and left upper quadrant. There is no guarding or rebound.  Musculoskeletal:     Cervical back: Neck supple.  Skin:    General: Skin is warm and dry.     Capillary Refill: Capillary refill takes less than 2 seconds.  Neurological:     General: No focal deficit present.     Mental Status: He is alert. Mental status is at  baseline.     Motor: No weakness.     Gait: Gait normal.  Psychiatric:        Mood and Affect: Mood normal.  Behavior: Behavior normal.      UC Treatments / Results  Labs (all labs ordered are listed, but only abnormal results are displayed) Labs Reviewed  COMPREHENSIVE METABOLIC PANEL - Abnormal; Notable for the following components:      Result Value   Total Protein 8.6 (*)    Total Bilirubin <0.1 (*)    All other components within normal limits  CBC WITH DIFFERENTIAL/PLATELET  LIPASE, BLOOD  H PYLORI, IGM, IGG, IGA AB    EKG   Radiology No results found.  Procedures Procedures (including critical care time)  Medications Ordered in UC Medications  alum & mag hydroxide-simeth (MAALOX/MYLANTA) 200-200-20 MG/5ML suspension 30 mL (30 mLs Oral Given 05/31/22 1255)    Initial Impression / Assessment and Plan / UC Course  I have reviewed the triage vital signs and the nursing notes.  Pertinent labs & imaging results that were available during my care of the patient were reviewed by me and considered in my medical decision making (see chart for details).   21 year old male with history of anxiety and GERD presents for 1 week history of epigastric and left upper quadrant pain with nausea and 3-day history of black stools.  He reports the abdominal pain is sharp and shooting.  Pain radiates to his back at times.  History of significant NSAID use over the past few weeks.  Also significant change in diet.  Vitals are stable and he is overall well-appearing.  On exam his abdomen is soft and he has tenderness palpation of the epigastric region but mostly of the left upper quadrant.  Chest clear auscultation heart regular rate rhythm.  Patient given GI cocktail in clinic.  Labs obtained include CBC, CMP, lipase and H. pylori testing.  Suspect PUD related to NSAIDs and GERD. However could be due to H. Pylori so we will test for that.   Patient reports that he no longer has  any pain or discomfort after taking the GI cocktail.  CBC is normal.  Lipase is normal and CMP without any significant abnormalities.  Discussed all results with patient.  Advised patient I believe his suspected GI ulcers are related to NSAID use, GERD, anxiety and diet changes.  We will have patient on 1 to 1-months of pantoprazole.  Also suggested Maalox or Mylanta over-the-counter and reviewed avoiding caffeine, smoking, alcohol and provided him with a handout on food choices for GERD.  Will send antibiotics if H. pylori testing is positive.  Reviewed ED precautions with him as well.  Referral placed to GI.  Patient needs upper endoscopy.   Final Clinical Impressions(s) / UC Diagnoses   Final diagnoses:  PUD (peptic ulcer disease)  Left upper quadrant abdominal pain  Nausea  Dark stools     Discharge Instructions      -Your labs are all reassuring. - I am glad the medication that you were given helped your symptoms.  It was a combination of Maalox and Mylanta which you can purchase over-the-counter. - I believe you have a stomach ulcer.  You need to take the acid reducing medication for the next several weeks I have also placed a referral to GI for you.  You likely will need another upper endoscopy. - Increase your fluid intake.  See the handout on food choices for acid reflux.  Avoid caffeine, alcohol, smoking and no more NSAIDs.  Tylenol only if you need something for pain. - If you develop fever, have coffee-ground emesis, feel weak/lethargic, have chest pain or discomfort with  breathing, shortness of breath, worsening abdominal pain or you continue to have dark stools or bright red bloody stools, you should go to emergency department for further work-up.     ED Prescriptions     Medication Sig Dispense Auth. Provider   pantoprazole (PROTONIX) 40 MG tablet Take 1 tablet (40 mg total) by mouth daily. 30 tablet Eusebio Friendly B, PA-C   ondansetron (ZOFRAN-ODT) 4 MG disintegrating  tablet Take 1 tablet (4 mg total) by mouth every 8 (eight) hours as needed for nausea or vomiting. 30 tablet Gareth Morgan      PDMP not reviewed this encounter.   Shirlee Latch, PA-C 05/31/22 1350    Eusebio Friendly B, [place] 07/08/22 [phone]

## 2022-05-31 NOTE — ED Triage Notes (Signed)
Pt has been taking OTC medication to cleanse his Gallbladder.   Pt has altered his diets and has been eating fruit only for the last 2 weeks.

## 2022-05-31 NOTE — ED Triage Notes (Signed)
Pt c/o abdominal pain under the ribcage.  Pt states that he has been having black stools for the last 3 days.   Pt states that the pain goes from under the ribs to lower back.  Pt puts on 8 pain patches to help for the pain.   Pt states that he used the restroom twice this morning and stool was Black.

## 2022-05-31 NOTE — Discharge Instructions (Addendum)
-  Your labs are all reassuring. - I am glad the medication that you were given helped your symptoms.  It was a combination of Maalox and Mylanta which you can purchase over-the-counter. - I believe you have a stomach ulcer.  You need to take the acid reducing medication for the next several weeks I have also placed a referral to GI for you.  You likely will need another upper endoscopy. - Increase your fluid intake.  See the handout on food choices for acid reflux.  Avoid caffeine, alcohol, smoking and no more NSAIDs.  Tylenol only if you need something for pain. - If you develop fever, have coffee-ground emesis, feel weak/lethargic, have chest pain or discomfort with breathing, shortness of breath, worsening abdominal pain or you continue to have dark stools or bright red bloody stools, you should go to emergency department for further work-up.

## 2022-06-03 LAB — H PYLORI, IGM, IGG, IGA AB
H Pylori IgG: 0.13 Index Value (ref 0.00–0.79)
H. Pylogi, Iga Abs: <9 units (ref 0.0–8.9)
H. Pylogi, Igm Abs: <9 units (ref 0.0–8.9)

## 2022-06-12 ENCOUNTER — Encounter: Payer: Self-pay | Admitting: Family Medicine

## 2022-06-13 NOTE — Telephone Encounter (Signed)
Please advise 

## 2022-06-21 ENCOUNTER — Other Ambulatory Visit: Payer: Self-pay

## 2022-06-21 ENCOUNTER — Emergency Department
Admission: EM | Admit: 2022-06-21 | Discharge: 2022-06-21 | Disposition: A | Discharge status: Home / Self Care | Payer: BC Managed Care – PPO | Attending: Emergency Medicine | Admitting: Emergency Medicine

## 2022-06-21 DIAGNOSIS — F1290 Cannabis use, unspecified, uncomplicated: Secondary | ICD-10-CM

## 2022-06-21 DIAGNOSIS — R112 Nausea with vomiting, unspecified: Secondary | ICD-10-CM | POA: Diagnosis present

## 2022-06-21 MED ORDER — ONDANSETRON HCL 4 MG/2ML IJ SOLN
4.0000 mg | Freq: Once | INTRAMUSCULAR | Status: AC
Start: 2022-06-21 — End: 2022-06-21
  Administered 2022-06-21: 4 mg via INTRAVENOUS
  Filled 2022-06-21: qty 2

## 2022-06-21 MED ORDER — LACTATED RINGERS IV BOLUS
1000.0000 mL | Freq: Once | INTRAVENOUS | Status: AC
  Administered 2022-06-21: 1000 mL via INTRAVENOUS

## 2022-06-21 NOTE — ED Provider Notes (Signed)
[facility] Provider Note    Event Date/Time   First MD Initiated Contact with Patient 06/21/22 1324     (approximate)   History   Headache   HPI  Asbury Hair is a 21 y.o. male  with history of GERD, anxiety, and as listed in EMR presents to the emergency department for evaluation after eating one half of a CBD gummy this morning.  He has had nausea and has vomited a few times since.  He states that he can "look at his arms and see that his veins are smaller than usual.".  He denies having used any other drugs or taking any other medications.      Physical Exam   Triage Vital Signs: ED Triage Vitals  Enc Vitals Group     BP 06/21/22 1222 120/72     Pulse Rate 06/21/22 1222 71     Resp 06/21/22 1222 19     Temp 06/21/22 1222 98.3 F (36.8 C)     Temp src --      SpO2 06/21/22 1222 100 %     Weight --      Height --      Head Circumference --      Peak Flow --      Pain Score 06/21/22 1221 6     Pain Loc --      Pain Edu? --      Excl. in GC? --     Most recent vital signs: Vitals:   06/21/22 1453 06/21/22 1625  BP: 112/65 110/68  Pulse: (!) 114 98  Resp: 18 18  Temp:    SpO2: 99% 98%    General: Awake, no distress.  CV:  Good peripheral perfusion.  Resp:  Normal effort.  Abd:  No distention.  Other:  Alert, oriented, clear speech, steady unassisted gait   ED Results / Procedures / Treatments   Labs (all labs ordered are listed, but only abnormal results are displayed) Labs Reviewed - No data to display   EKG  Not indicated   RADIOLOGY  Image and radiology report reviewed by me.  Not indicated  PROCEDURES:  Critical Care performed: No  Procedures   MEDICATIONS ORDERED IN ED: Medications  lactated ringers bolus 1,000 mL (0 mLs Intravenous Stopped 06/21/22 1625)  ondansetron (ZOFRAN) injection 4 mg (4 mg Intravenous Given 06/21/22 1344)     IMPRESSION / MDM / ASSESSMENT AND PLAN / ED COURSE   I have  reviewed the triage note.  Differential diagnosis includes, but is not limited to, cannabis intoxication  21 year old male presenting to the emergency department for treatment and evaluation after eating one half of a CBD gummy this morning and then feeling strange.  He has vomited a couple of times.  He states that he has never taken any type of drugs and has never tried CBD Gummies in the past.  On exam, he is alert and oriented.  He seems relaxed but is without evidence of severe intoxication.  Vital signs are stable.  Plan will be to give him some fluids and Zofran.  Patient monitored for few hours and had no additional vomiting or change in behavior.  He remained alert and oriented the entire stay.  He will be discharged home with instructions to avoid all illicit drug use as well as the CBD Gummies.  Patient states that he "never wants to do that again."  He has called friend or family for a ride home per  nursing staff.     FINAL CLINICAL IMPRESSION(S) / ED DIAGNOSES   Final diagnoses:  Cannabis use without complication     Rx / DC Orders   ED Discharge Orders     None        Note:  This document was prepared using Dragon voice recognition software and may include unintentional dictation errors.   Chinita Pester, FNP 06/21/22 1646    Chesley Noon, MD 06/21/22 [phone]

## 2022-06-21 NOTE — Discharge Instructions (Signed)
Avoid using cannabis  or CBD gummies. Return to the ER for concerns.

## 2022-06-21 NOTE — ED Notes (Signed)
Pt states he feels dehydrated. Pt given water and instructed to sip slowly.

## 2022-06-21 NOTE — ED Triage Notes (Signed)
Pt comes via EMs from home with c/o N/V and headache. Pt states he took 75 mg Delta 8 gummies at 0930-10. Pt states this was first time trying this kind. VSS

## 2022-06-24 ENCOUNTER — Encounter: Payer: Self-pay | Admitting: Family Medicine

## 2022-06-24 ENCOUNTER — Ambulatory Visit (INDEPENDENT_AMBULATORY_CARE_PROVIDER_SITE_OTHER): Payer: BC Managed Care – PPO | Admitting: Family Medicine

## 2022-06-24 ENCOUNTER — Telehealth: Payer: Self-pay | Admitting: Family Medicine

## 2022-06-24 VITALS — BP 120/68 | HR 88 | Ht 69.0 in | Wt 174.2 lb

## 2022-06-24 DIAGNOSIS — F418 Other specified anxiety disorders: Secondary | ICD-10-CM | POA: Diagnosis not present

## 2022-06-24 MED ORDER — SERTRALINE HCL 50 MG PO TABS: 50.0000 mg | ORAL_TABLET | Freq: Every day | ORAL | 3 refills | Status: DC

## 2022-06-24 MED ORDER — TRAZODONE HCL 50 MG PO TABS: 25.0000 mg | ORAL_TABLET | Freq: Every evening | ORAL | 3 refills | Status: DC | PRN

## 2022-06-24 NOTE — Progress Notes (Signed)
     Primary Care / Sports Medicine Office Visit  Patient Information:  Patient ID: Scott Silva, male DOB: 2001/07/17 Age: 21 y.o. MRN: [id number]   Scott Silva is a pleasant 21 y.o. male presenting with the following:  Chief Complaint  Patient presents with   Depression   Anxiety    Vitals:   06/24/22 1444  BP: 120/68  Pulse: 88  SpO2: 98%   Vitals:   06/24/22 1444  Weight: 174 lb 3.2 oz (79 kg)  Height: 5\' 9"  (1.753 m)   Body mass index is 25.72 kg/m.  No results found.   Independent interpretation of notes and tests performed by another provider:   None  Procedures performed:   None  Pertinent History, Exam, Impression, and Recommendations:   Problem List Items Addressed This Visit       Other   Depression with anxiety - Primary    Chronic condition with ongoing symptomatology, unfortunately has led to work related issues and work-enforced time off due to interoffice conflict.  PHQ and GAD scores are elevated when compared to prior interval visits and he endorses significant interference of mood with day-to-day interactions.  He has been dosing hydroxyzine sporadically with benefit, did discuss the significance of his clinical course and pharmacologic/nonpharmacologic methods to address them.  He is amenable to initiation of sertraline at 50 mg daily, nightly as needed trazodone 25-50 mg, can continue as needed hydroxyzine, and referral to psychiatry has been placed for their input and cognitive behavioral therapy.  Lastly, he was encouraged to pursue nonpharmacologic treatment avenues, patient oriented information provided today.  A work note was also provided and he will return for an office visit in 2 months.      Relevant Medications   sertraline (ZOLOFT) 50 MG tablet   traZODone (DESYREL) 50 MG tablet   Other Relevant Orders   Ambulatory referral to Psychiatry     Orders & Medications Meds ordered this encounter  Medications   sertraline  (ZOLOFT) 50 MG tablet    Sig: Take 1 tablet (50 mg total) by mouth daily.    Dispense:  30 tablet    Refill:  3   traZODone (DESYREL) 50 MG tablet    Sig: Take 0.5-1 tablets (25-50 mg total) by mouth at bedtime as needed for sleep.    Dispense:  30 tablet    Refill:  3   Orders Placed This Encounter  Procedures   Ambulatory referral to Psychiatry     Return in about 2 months (around 08/24/2022).     13/08/2022, MD   Primary Care Sports Medicine [facility]

## 2022-06-24 NOTE — Assessment & Plan Note (Signed)
Chronic condition with ongoing symptomatology, unfortunately has led to work related issues and work-enforced time off due to interoffice conflict.  PHQ and GAD scores are elevated when compared to prior interval visits and he endorses significant interference of mood with day-to-day interactions.  He has been dosing hydroxyzine sporadically with benefit, did discuss the significance of his clinical course and pharmacologic/nonpharmacologic methods to address them.  He is amenable to initiation of sertraline at 50 mg daily, nightly as needed trazodone 25-50 mg, can continue as needed hydroxyzine, and referral to psychiatry has been placed for their input and cognitive behavioral therapy.  Lastly, he was encouraged to pursue nonpharmacologic treatment avenues, patient oriented information provided today.  A work note was also provided and he will return for an office visit in 2 months.

## 2022-06-24 NOTE — Telephone Encounter (Signed)
noted 

## 2022-06-24 NOTE — Telephone Encounter (Signed)
Copied from CRM [id number]. Topic: General - Other >> Jun 24, 2022  4:11 PM [id number] J wrote: Reason for CRM: pt called in to make provider aware that his HR will be calling in to provide him with the fax # to send in the paperwork that he was provided.

## 2022-06-24 NOTE — Patient Instructions (Addendum)
-   Take sertraline daily  - Take trazodone 25-50 mg nightly as-needed for sleep - Remain out of work until 07/15/2022 - Return in 2 months

## 2022-06-27 ENCOUNTER — Ambulatory Visit: Payer: BC Managed Care – PPO | Admitting: Family Medicine

## 2022-07-03 ENCOUNTER — Telehealth: Payer: Self-pay | Admitting: Family Medicine

## 2022-07-03 NOTE — Telephone Encounter (Signed)
Copied from [place] [id number]. Topic: General - Other >> Jul 02, 2022  5:15 PM Ja-Kwan M wrote: Reason for CRM: Ariel with [employer] called for update on medical records request faxed on 06/27/22. Cb# [phone]

## 2022-07-04 NOTE — Telephone Encounter (Signed)
Received in Dr. Zigmund Daniel box to be completed.  KP

## 2022-07-08 NOTE — Telephone Encounter (Signed)
Areil with [employer] returning call to make sure received, stated received, Caller will fu latter wk.

## 2022-07-08 NOTE — Progress Notes (Signed)
Ok updated note.

## 2022-07-08 NOTE — Telephone Encounter (Signed)
Pt called today to see if the paper work that was faxed to the office from his employer has been completed (for short term disability) / he also stated that [place] has advised him that they need his medical records and has not received their request / please advise   The deadline for [employer] is tomorrow

## 2022-07-08 NOTE — Telephone Encounter (Signed)
Please review.  KP

## 2022-07-09 NOTE — Telephone Encounter (Signed)
Papers have been faxed.  

## 2022-07-11 ENCOUNTER — Encounter: Payer: Self-pay | Admitting: Family Medicine

## 2022-07-11 NOTE — Telephone Encounter (Signed)
Please advise 

## 2022-07-15 NOTE — Telephone Encounter (Signed)
Ok to return to work with no restrictions.

## 2022-07-17 ENCOUNTER — Other Ambulatory Visit: Payer: Self-pay | Admitting: Family Medicine

## 2022-07-17 DIAGNOSIS — F418 Other specified anxiety disorders: Secondary | ICD-10-CM

## 2022-07-17 NOTE — Telephone Encounter (Signed)
Requested medication (s) are due for refill today: request for 90  Requested medication (s) are on the active medication list: yes  Last refill:  06/24/22  Future visit scheduled yes  Notes to clinic:  Unable to refill per protocol, last refill by provider 06/24/22 for 30 and 2, pharmacy request 90 day with DX code.     Requested Prescriptions  Pending Prescriptions Disp Refills   traZODone (DESYREL) 50 MG tablet [Pharmacy Med Name: TRAZODONE 50 MG TABLET] 90 tablet 2    Sig: TAKE 0.5-1 TABLETS BY MOUTH AT BEDTIME AS NEEDED FOR SLEEP.     Psychiatry: Antidepressants - Serotonin Modulator Passed - 07/17/2022  2:32 PM      Passed - Completed PHQ-2 or PHQ-9 in the last 360 days      Passed - Valid encounter within last 6 months    Recent Outpatient Visits           3 weeks ago Depression with anxiety   [facility] at [place], Earley Abide, MD   4 months ago Peanut allergy   [place] [facility] at [place], Earley Abide, MD   6 months ago [place] and [facility] Medicine at [facility], Earley Abide, MD   7 months ago Chest pain, unspecified type   [facility] at [facility], Earley Abide, MD       Future Appointments             In 1 month Zigmund Daniel Earley Abide, MD [facility] and [facility] at [facility], [place]             sertraline (ZOLOFT) 50 MG tablet [Pharmacy Med Name: SERTRALINE HCL 50 MG TABLET] 90 tablet 2    Sig: TAKE 1 [place]     Psychiatry:  Antidepressants - SSRI - sertraline Passed - 07/17/2022  2:32 PM      Passed - AST in normal range and within 360 days    AST  Date Value Ref Range Status  05/31/2022 16 15 - 41 U/L Final         Passed - ALT in normal range and within 360 days    ALT  Date Value Ref Range Status  05/31/2022 14 0 - 44 U/L Final          Passed - Completed PHQ-2 or PHQ-9 in the last 360 days      Passed - Valid encounter within last 6 months    Recent Outpatient Visits           3 weeks ago Depression with anxiety   [place] [facility] at [place], Earley Abide, MD   4 months ago Peanut allergy   [place] [facility] at [place], Earley Abide, MD   6 months ago [place] and Sports Medicine at [facility], Earley Abide, MD   7 months ago Chest pain, unspecified type   [facility] at [facility], Earley Abide, MD       Future Appointments             In 1 month Zigmund Daniel, Earley Abide, MD [place] [facility] at [facility]

## 2022-07-18 ENCOUNTER — Telehealth: Payer: Self-pay | Admitting: Family Medicine

## 2022-07-18 ENCOUNTER — Other Ambulatory Visit: Payer: Self-pay | Admitting: Family Medicine

## 2022-07-18 DIAGNOSIS — F418 Other specified anxiety disorders: Secondary | ICD-10-CM

## 2022-07-18 IMAGING — CT CT HEAD W/O CM
4 series · 17 of 47 positions shown, 19 images · non-contrast
Comparison: None.

CLINICAL DATA: Neuro deficit, acute, stroke suspected



[Series 2: head wo · axial · 0.46mm/px · z∈[+357,+482]mm · 7 of 35 slices shown, 9 images]
[im 5/35  brain]
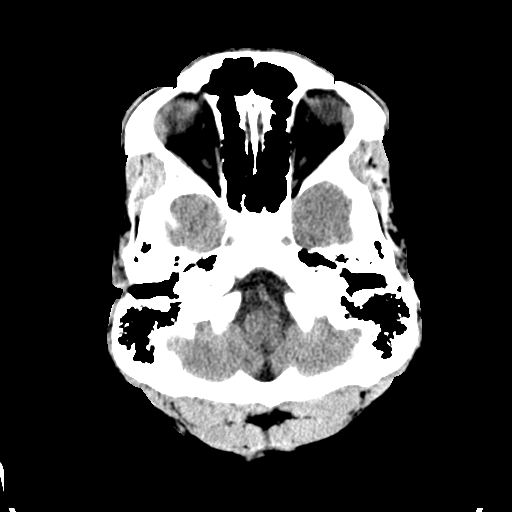
[im 5/35  bone]
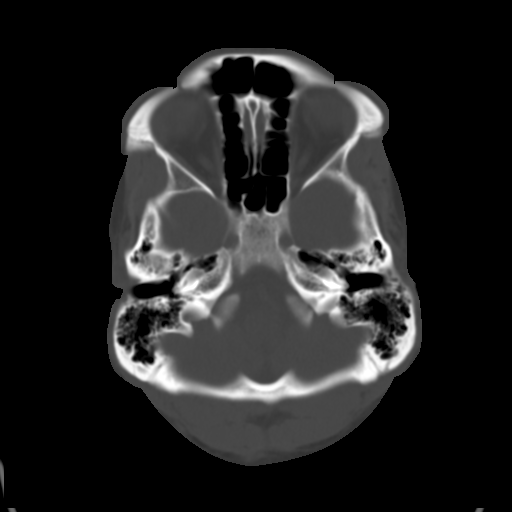
[im 9/35  brain]
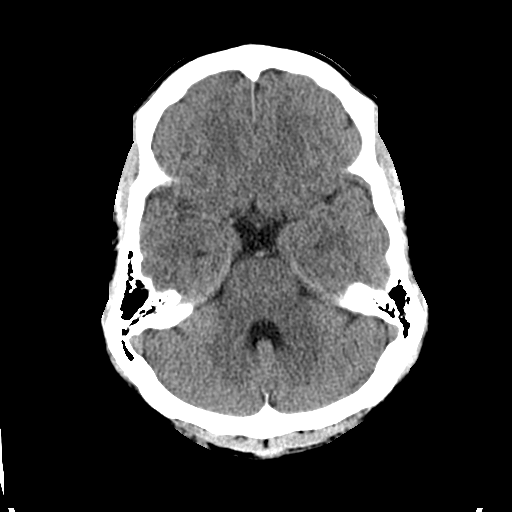
[im 13/35  brain]
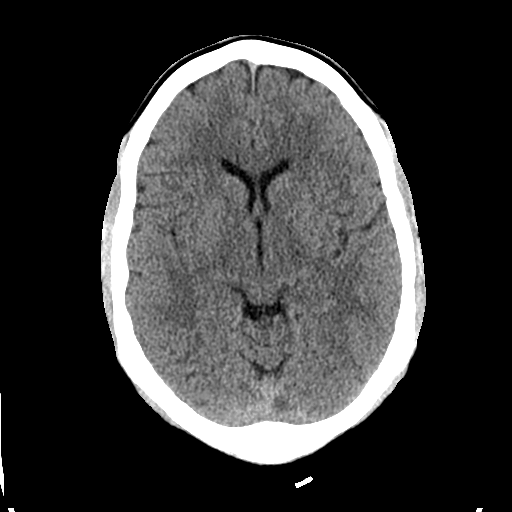
[im 18/35  brain]
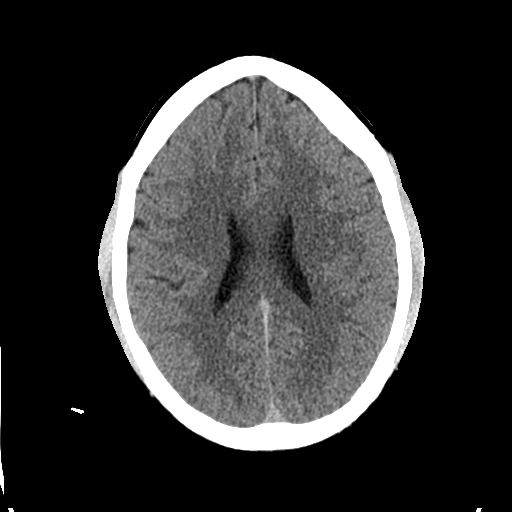
[im 22/35  brain]
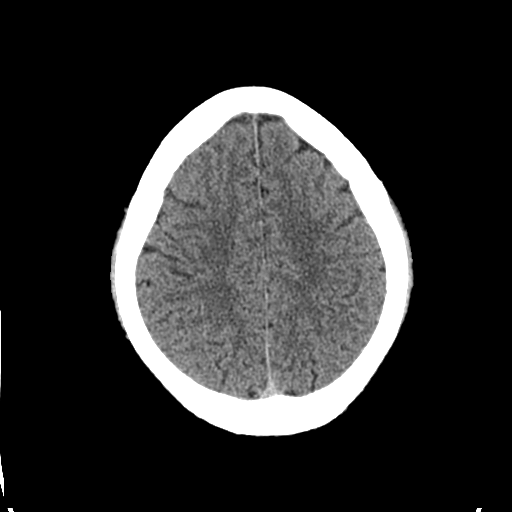
[im 22/35  bone]
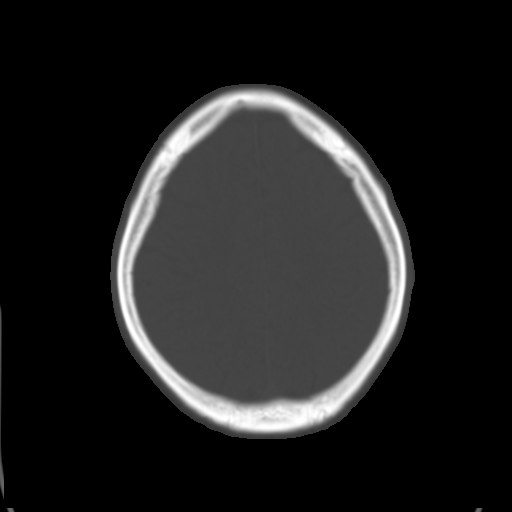
[im 26/35  brain]
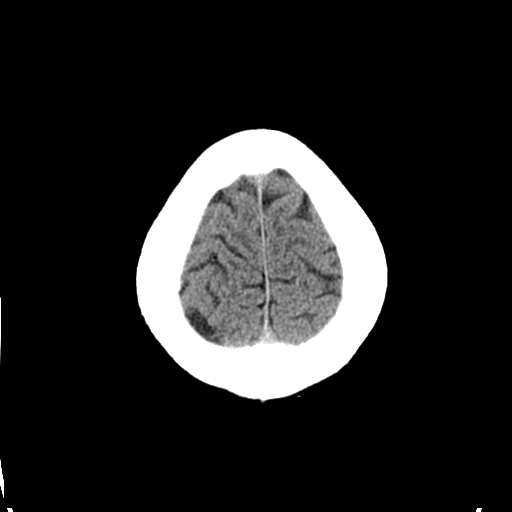
[im 30/35  brain]
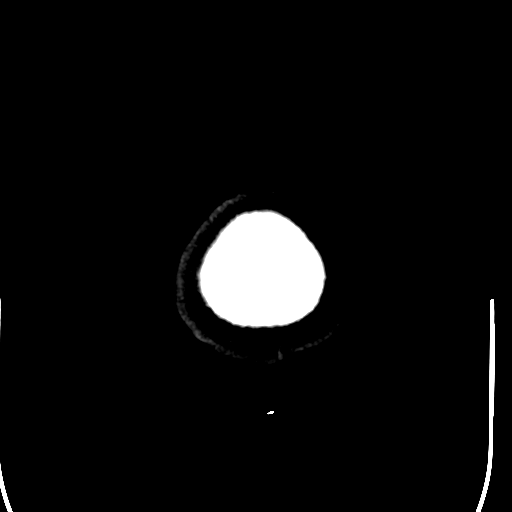

[Series 3: head bone · axial · 0.46mm/px · z∈[+353,+413]mm · 4 of 86 slices shown]
[im 9/86  bone]
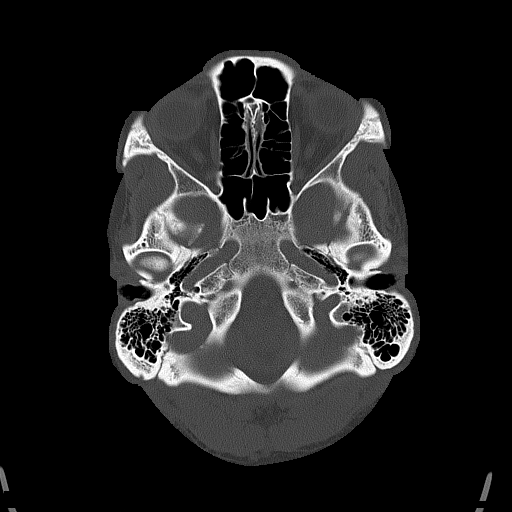
[im 18/86  bone]
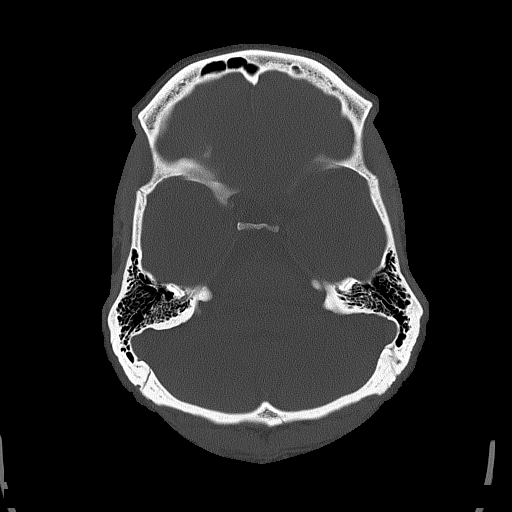
[im 26/86  bone]
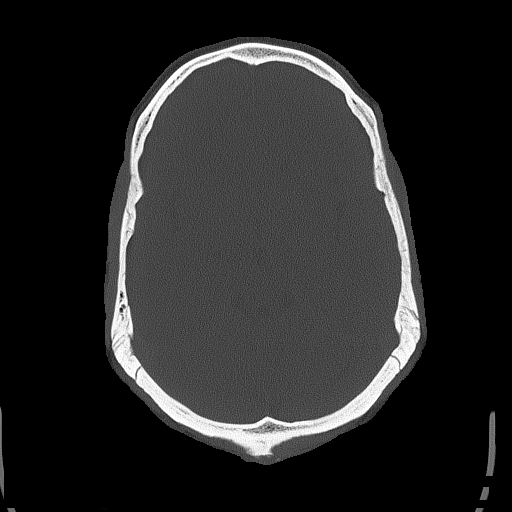
[im 39/86  bone]
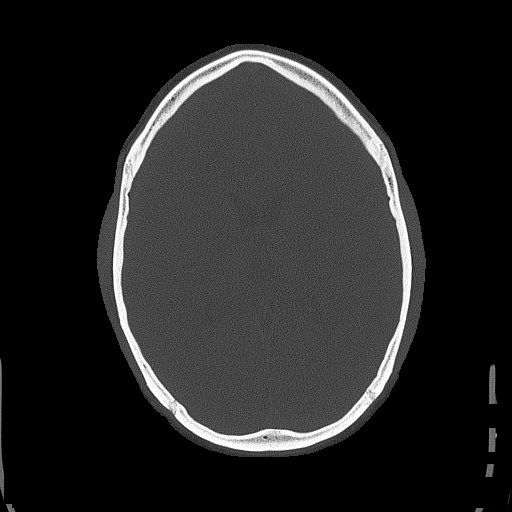

[Series 4: coronal soft tissue · coronal · 0.34mm/px · 3 of 68 slices shown]
[im 23/68  brain]
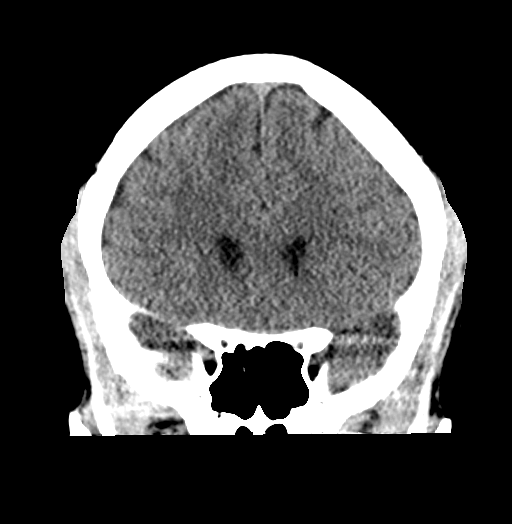
[im 30/68  brain]
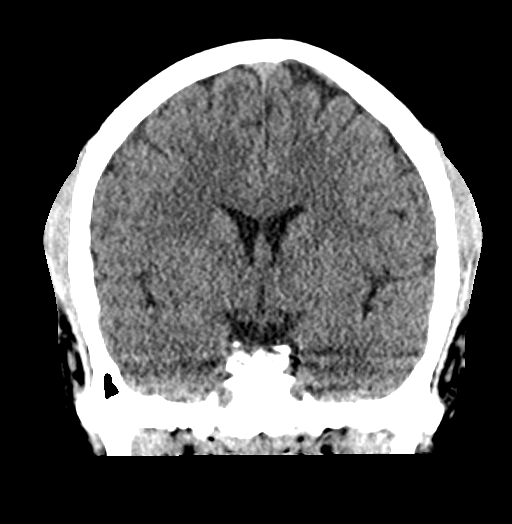
[im 38/68  brain]
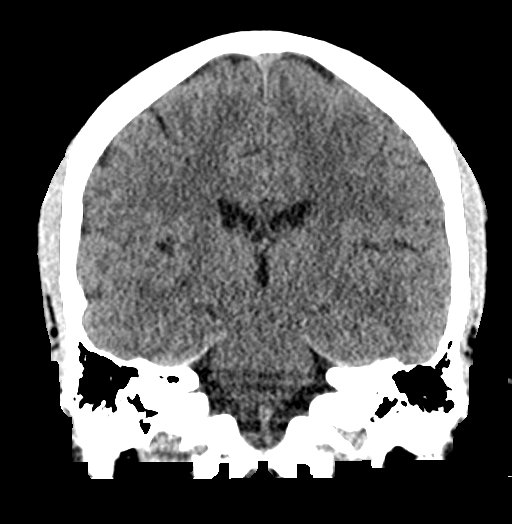

[Series 5: sagittal soft tissue · sagittal · 0.37mm/px · 3 of 59 slices shown]
[im 20/59  brain]
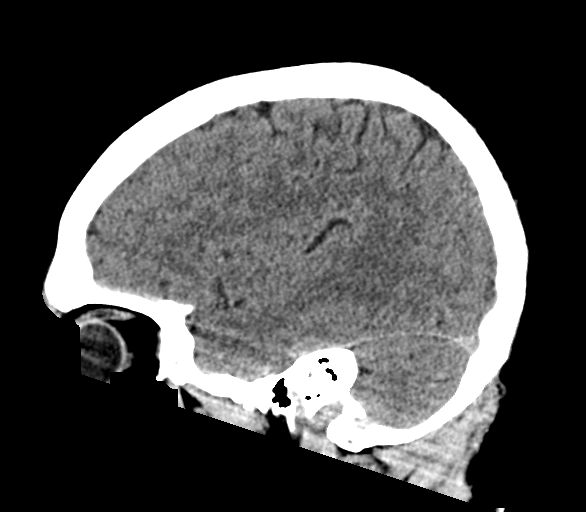
[im 30/59  brain]
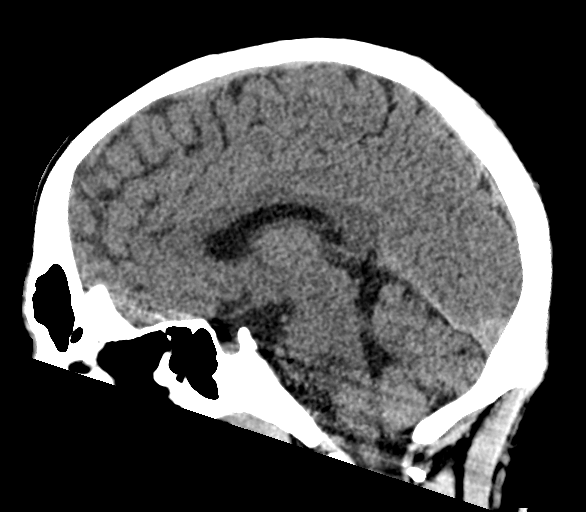
[im 39/59  brain]
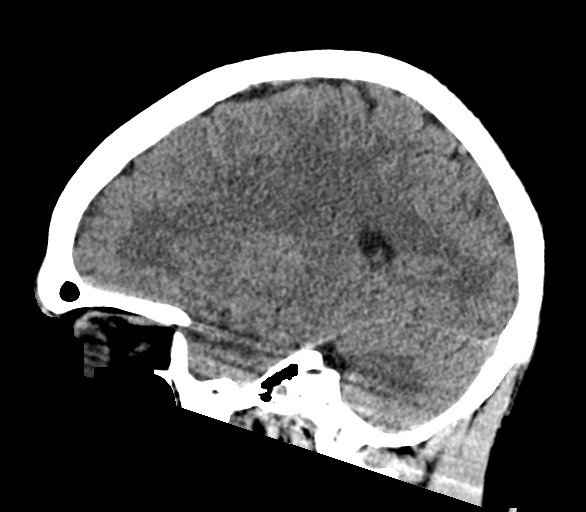

[17 of 47 positions shown; findings below may reference images not displayed]

FINDINGS: Brain: There is no acute intracranial hemorrhage, mass effect, or
edema. Gray-white differentiation is preserved. There is no
extra-axial fluid collection. Ventricles and sulci are within normal
limits in size and configuration.

Vascular: No hyperdense vessel or unexpected calcification.

Skull: Calvarium is unremarkable.

Sinuses/Orbits: No acute finding.

Other: None.
IMPRESSION: No acute intracranial abnormality.

## 2022-07-18 MED ORDER — HYDROXYZINE PAMOATE 25 MG PO CAPS: 25.0000 mg | ORAL_CAPSULE | Freq: Three times a day (TID) | ORAL | 0 refills | Status: AC | PRN

## 2022-07-18 NOTE — Telephone Encounter (Signed)
Pt checking status of work note, advised pt of provider's response via mychart  Pt states he has not scheduled an appt w/ psychiatry as of yet  Pt states he may loose his job if note is not provided  Pt requesting note faxed  Fax [phone]

## 2022-07-18 NOTE — Telephone Encounter (Signed)
Note wrote and handed to pt

## 2022-08-26 ENCOUNTER — Ambulatory Visit: Payer: BC Managed Care – PPO | Admitting: Family Medicine

## 2022-08-27 ENCOUNTER — Encounter: Payer: Self-pay | Admitting: Family Medicine

## 2022-08-28 ENCOUNTER — Ambulatory Visit: Payer: BC Managed Care – PPO | Admitting: Family Medicine

## 2022-09-17 IMAGING — DX DG CHEST 1V
1 series · 1 of 1 positions shown · non-contrast
Comparison: 12/21/2021

CLINICAL DATA: Chest pain and shortness of breath.

EXAM:
CHEST  1 VIEW

[chest ap]
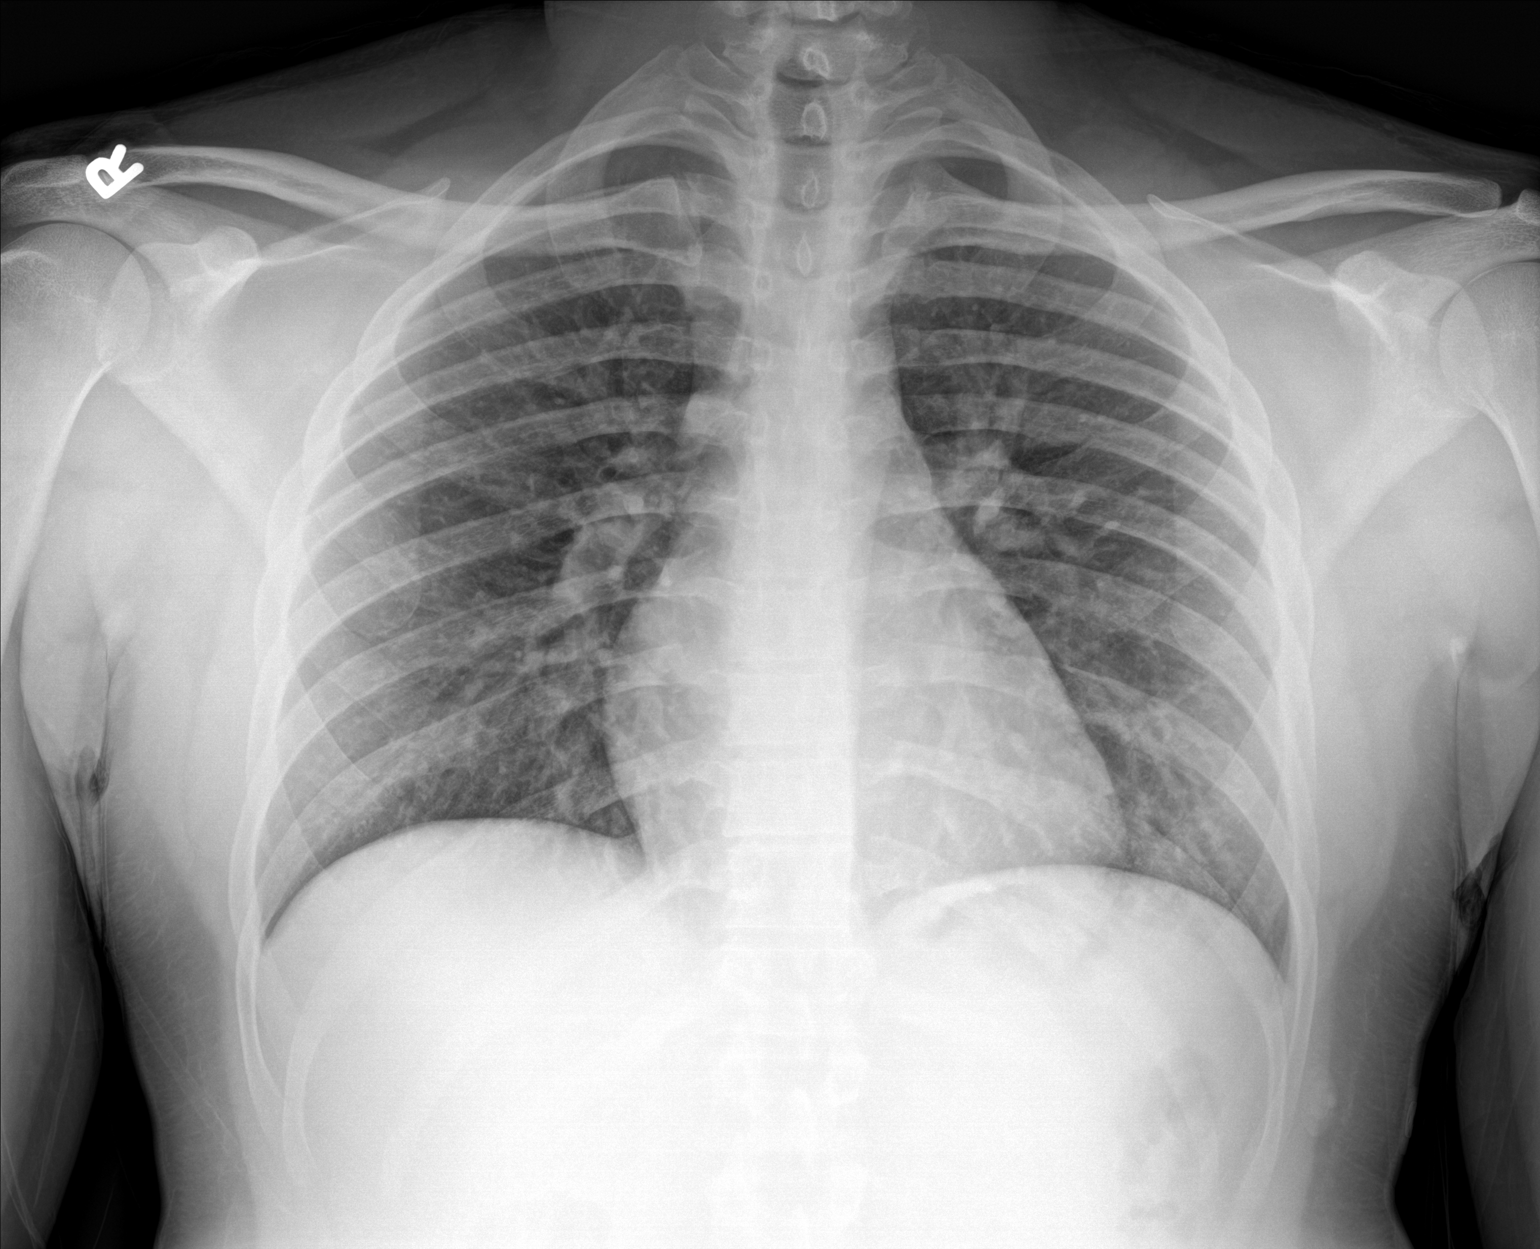

[1 of 1 positions shown; findings below may reference images not displayed]

FINDINGS: 3358 hours. The lungs are clear without focal pneumonia, edema,
pneumothorax or pleural effusion. The cardiopericardial silhouette
is within normal limits for size. The visualized bony structures of
the thorax are unremarkable.
IMPRESSION: No active disease.

## 2024-08-10 ENCOUNTER — Telehealth: Payer: Self-pay

## 2024-08-10 NOTE — Transitions of Care (Post Inpatient/ED Visit) (Signed)
" ° °  08/10/2024  Name: Scott Silva MRN: [id number] DOB: 2000/12/24  Today's TOC FU Call Status: Today's TOC FU Call Status:: Unsuccessful Call (1st Attempt) Unsuccessful Call (1st Attempt) Date: 08/10/24  Attempted to reach the patient regarding the most recent Inpatient/ED visit.  Follow Up Plan: Additional outreach attempts will be made to reach the patient to complete the Transitions of Care (Post Inpatient/ED visit) call.   Signature   Roetta Chard "

## 2024-10-20 NOTE — Telephone Encounter (Signed)
 TOC for hospital follow up. Patient out reach. Unable to reach.   JM
